# Patient Record
Sex: Female | Born: 1970 | Race: White | Hispanic: No | Smoking: Former smoker
Health system: Southern US, Community
[De-identification: ages and names within clinical notes are randomized; demographics above are authoritative.]

## PROBLEM LIST (undated history)

## (undated) DIAGNOSIS — D649 Anemia, unspecified: Secondary | ICD-10-CM

## (undated) DIAGNOSIS — F419 Anxiety disorder, unspecified: Secondary | ICD-10-CM

## (undated) DIAGNOSIS — O24419 Gestational diabetes mellitus in pregnancy, unspecified control: Secondary | ICD-10-CM

## (undated) DIAGNOSIS — F329 Major depressive disorder, single episode, unspecified: Secondary | ICD-10-CM

## (undated) DIAGNOSIS — F32A Depression, unspecified: Secondary | ICD-10-CM

## (undated) HISTORY — DX: Depression, unspecified: F32.A

## (undated) HISTORY — DX: Gestational diabetes mellitus in pregnancy, unspecified control: O24.419

## (undated) HISTORY — DX: Anemia, unspecified: D64.9

## (undated) HISTORY — DX: Anxiety disorder, unspecified: F41.9

## (undated) HISTORY — PX: IUD REMOVAL: SHX5392

## (undated) HISTORY — PX: INTRAUTERINE DEVICE (IUD) INSERTION: SHX5877

## (undated) HISTORY — DX: Major depressive disorder, single episode, unspecified: F32.9

---

## 1998-04-23 ENCOUNTER — Emergency Department (HOSPITAL_COMMUNITY): Admission: EM | Admit: 1998-04-23 | Discharge: 1998-04-24 | Payer: Self-pay | Admitting: Emergency Medicine

## 1998-04-24 ENCOUNTER — Encounter: Payer: Self-pay | Admitting: Emergency Medicine

## 2003-01-16 ENCOUNTER — Encounter: Payer: Self-pay | Admitting: General Surgery

## 2003-01-16 ENCOUNTER — Ambulatory Visit (HOSPITAL_COMMUNITY): Admission: RE | Admit: 2003-01-16 | Discharge: 2003-01-16 | Payer: Self-pay | Admitting: *Deleted

## 2004-09-17 ENCOUNTER — Emergency Department (HOSPITAL_COMMUNITY): Admission: EM | Admit: 2004-09-17 | Discharge: 2004-09-17 | Payer: Self-pay | Admitting: Emergency Medicine

## 2007-01-14 ENCOUNTER — Other Ambulatory Visit: Admission: RE | Admit: 2007-01-14 | Discharge: 2007-01-14 | Payer: Self-pay | Admitting: Obstetrics & Gynecology

## 2007-02-12 ENCOUNTER — Emergency Department (HOSPITAL_COMMUNITY): Admission: EM | Admit: 2007-02-12 | Discharge: 2007-02-12 | Payer: Self-pay | Admitting: Family Medicine

## 2007-02-13 ENCOUNTER — Emergency Department (HOSPITAL_COMMUNITY): Admission: EM | Admit: 2007-02-13 | Discharge: 2007-02-13 | Payer: Self-pay | Admitting: Family Medicine

## 2007-02-13 ENCOUNTER — Emergency Department (HOSPITAL_COMMUNITY): Admission: EM | Admit: 2007-02-13 | Discharge: 2007-02-13 | Payer: Self-pay | Admitting: Emergency Medicine

## 2007-02-14 ENCOUNTER — Inpatient Hospital Stay (HOSPITAL_COMMUNITY): Admission: EM | Admit: 2007-02-14 | Discharge: 2007-02-17 | Payer: Self-pay | Admitting: Emergency Medicine

## 2007-02-15 ENCOUNTER — Ambulatory Visit: Payer: Self-pay | Admitting: Infectious Diseases

## 2008-01-19 ENCOUNTER — Other Ambulatory Visit: Admission: RE | Admit: 2008-01-19 | Discharge: 2008-01-19 | Payer: Self-pay | Admitting: Obstetrics and Gynecology

## 2008-03-24 DIAGNOSIS — R142 Eructation: Secondary | ICD-10-CM

## 2008-03-24 DIAGNOSIS — R141 Gas pain: Secondary | ICD-10-CM | POA: Insufficient documentation

## 2008-03-24 DIAGNOSIS — R143 Flatulence: Secondary | ICD-10-CM

## 2008-03-24 DIAGNOSIS — F411 Generalized anxiety disorder: Secondary | ICD-10-CM | POA: Insufficient documentation

## 2008-03-24 DIAGNOSIS — R109 Unspecified abdominal pain: Secondary | ICD-10-CM | POA: Insufficient documentation

## 2008-05-09 ENCOUNTER — Ambulatory Visit: Payer: Self-pay | Admitting: Internal Medicine

## 2008-05-09 DIAGNOSIS — R1033 Periumbilical pain: Secondary | ICD-10-CM | POA: Insufficient documentation

## 2008-05-10 LAB — CONVERTED CEMR LAB
ALT: 14 units/L (ref 0–35)
AST: 19 units/L (ref 0–37)
Albumin: 4.3 g/dL (ref 3.5–5.2)
Alkaline Phosphatase: 34 units/L — ABNORMAL LOW (ref 39–117)
BUN: 8 mg/dL (ref 6–23)
CO2: 29 meq/L (ref 19–32)
Calcium: 9.1 mg/dL (ref 8.4–10.5)
Chloride: 106 meq/L (ref 96–112)
Creatinine, Ser: 0.5 mg/dL (ref 0.4–1.2)
GFR calc Af Amer: 179 mL/min
GFR calc non Af Amer: 148 mL/min
Glucose, Bld: 94 mg/dL (ref 70–99)
Potassium: 3.7 meq/L (ref 3.5–5.1)
Sed Rate: 4 mm/hr (ref 0–22)
Sodium: 141 meq/L (ref 135–145)
TSH: 1.32 microintl units/mL (ref 0.35–5.50)
Total Bilirubin: 0.8 mg/dL (ref 0.3–1.2)
Total Protein: 7.6 g/dL (ref 6.0–8.3)

## 2008-05-11 LAB — CONVERTED CEMR LAB: Tissue Transglutaminase Ab, IgA: 0.3 units (ref <7)

## 2008-06-15 ENCOUNTER — Encounter: Payer: Self-pay | Admitting: Internal Medicine

## 2010-08-18 ENCOUNTER — Encounter: Payer: Self-pay | Admitting: Internal Medicine

## 2010-08-27 NOTE — Assessment & Plan Note (Signed)
Summary: STOMCG ISSUES AFTER MEALS/FH   History of Present Illness Visit Type: consult Primary GI MD: Lina Sar MD Primary Provider: Rada Hay Requesting Provider: Willey Blade Chief Complaint: Stomach problems after meals History of Present Illness:   40 year old white female with chronic digestive problems specifically bloating and abdominal pain postprandially, food intolerance, irregular bowel habits. Her symptoms date back to childhood . She was told by her parents as well as by her first husband that her problems were  related to  her nerves. She now for the first time has decided to pursue this   with GI work up..  She has  been on gluten-free diet for several months because she felt that wheat products are causing lot of her symptoms .She also claims to be lactose intolerant. Weight has been stable. She smokes 5-6 cigarettes a day and has a glass of wine every day. There's no family history of gallbladder disease. There is no family history of Crohn's disease or ulcerative colitis. Her symptoms are worse when she stressed out.   GI Review of Systems    Reports abdominal pain, bloating, heartburn, and  nausea.     Location of  Abdominal pain: upper abdomen.    Denies belching, chest pain, dysphagia with liquids, dysphagia with solids, loss of appetite, vomiting, vomiting blood, weight loss, and  weight gain.      Reports diarrhea and  liver problems.     Denies anal fissure, black tarry stools, change in bowel habit, constipation, diverticulosis, fecal incontinence, heme positive stool, hemorrhoids, irritable bowel syndrome, jaundice, light color stool, rectal bleeding, and  rectal pain.     Prior Medication List:  No prior medications documented  Updated Prior Medication List: * PROGESTERONE RING WITH IUD Change once per week  Current Allergies (reviewed today): ! ERYTHROMYCIN  Past Medical History:    Reviewed history from 03/24/2008 and no changes required:      Current Problems:        ABDOMINAL BLOATING (ICD-787.3)       ABDOMINAL CRAMPS (ICD-789.00)       ANXIETY (ICD-300.00)          Family History:    Reviewed history and no changes required:       No FH of Colon Cancer:       Family History of Diabetes: mother  Social History:    Reviewed history from 03/24/2008 and no changes required:       Occupation: Immunologist       Patient currently smokes. -5 cigarettes daily       Alcohol Use - yes-1 glass wine daily       Illicit Drug Use - no       Patient gets regular exercise.-walks 4x per week    Review of Systems       The patient complains of allergy/sinus and anxiety-new.     Vital Signs:  Patient Profile:   40 Years Old Female Height:     61 inches Weight:      130.25 pounds BMI:     24.70 BSA:     1.58 Pulse rate:   60 / minute Pulse rhythm:   regular BP sitting:   100 / 62  (left arm)  Vitals Entered By: Paulene Floor, RN (May 09, 2008 1:25 PM)                  Physical Exam  General:     Well developed, well nourished, no  acute distress. Neck:     Supple; no masses or thyromegaly. Lungs:     Clear throughout to auscultation. Heart:     Regular rate and rhythm; no murmurs, rubs,  or bruits. Abdomen:     soft with decreased muscle support, hyperactive bowel sounds, no tenderness, liver edge at costal margin Rectal:     normal rectal tone and normal perianal area. Stool is Hemoccult negative Extremities:     No clubbing, cyanosis, edema or deformities noted.    Impression & Recommendations:  Problem # 1:  ABDOMINAL PAIN, PERIUMBILICAL (ICD-789.05) abdominal discomfort bloating and food intolerance suggestive of an irritable bowel syndrome. We need to consider  celiac sprue, inflammatory bowel disease or other  GI conditions which would  explain her symptoms. We will proceed with abdominal ultrasound and tissue transglutaminase levels. Start  Bentyl 10 mg twice a day. She is  currently undergoing psychological evauation and will be started on antidepressants. I would like to proceed with endoscopic exam of the upper and lower GI tract for the purpose of small bowel biopsies and to rule out collagenous or microscopic colitis. Patient would like to wait until her blood tests are back to schedule. Orders: T-Tissue Transglutamase Ab IgA [phone]) Ultrasound Abdomen (UAS) TLB-Sedimentation Rate (ESR) (85651-ESR) TLB-CMP (Comprehensive Metabolic Pnl) (80053-COMP) TLB-TSH (Thyroid Stimulating Hormone) (84443-TSH)   Problem # 2:  ANXIETY (ICD-300.00) chronic anxiety and possible depression. In the care of the counselor. Patient will be  started on antidepressants.   Patient Instructions: 1)  upper abdominal ultrasound 2)  Blood tests including TSH, hepatic function, tissue transglutaminase, sedimentation rate 3)  Bentyl  10 mg one p.o. b.i.d 4)  likely upper endoscopy with small bowel biopsy and colonoscopy 5)  Copy Sent To:Dr Leda Quail    Prescriptions: BENTYL 10 MG CAPS (DICYCLOMINE HCL) Take 1 tablet by mouth two times a day  #60 x 1   Entered by:   Hortense Ramal CMA   Authorized by:   Hart Carwin MD   Signed by:   Hortense Ramal CMA on 05/09/2008   Method used:   Electronically to        [facility] [address] [phone]* (retail)       [address]       [place], [place]  [address]       Ph: [phone] or [phone]       Fax: [phone]   RxID:   [id number]  ]

## 2010-08-27 NOTE — Letter (Signed)
Summary: Appointment Reminder  [facility]  [address] [place], [place] [address]   Phone: [phone]  Fax: [phone]        June 15, 2008 MRN: [id number]    Debbie Cline [address] [place], [place]  [address]    Dear Debbie Cline,   Our office has been made aware that you never had the abdominal ultrasound that was recommended for you by Dr. Juanda Chance. We have called and left messages on multiple occasions and have unfortuntely been unable to contact you by phone.   It is very important that we reach you to reschedule an appointment for ultrasound. We hope that you continue to allow [place] to participate in your health care needs. Please contact us [phone] at your earliest convenience to schedule your appointment.     Sincerely,    Hortense Ramal CMA

## 2010-12-10 NOTE — H&P (Signed)
[name], [name] NO.:  [id number]   MEDICAL RECORD NO.:  [id number]          PATIENT TYPE:  INP   LOCATION:  0104                         FACILITY:  [facility]   PHYSICIAN:  Hettie Holstein, D.O.    DATE OF BIRTH:  Jan 31, 1971   DATE OF ADMISSION:  02/14/2007  DATE OF DISCHARGE:                              HISTORY & PHYSICAL   PRIMARY CARE PHYSICIAN:  Dr. Marisue Brooklyn.   CHIEF COMPLAINT:  Facial swelling refractory to outpatient management.   HISTORY OF PRESENTING ILLNESS:  [name] is a very pleasant 40-  year-old healthy female who states that she typically has issues with  ALLERGIC REACTIONS TO POISON IVY ON A YEARLY BASIS.  She states that she  began Thursday with a small blotch on her face just before bed.  This  began to itch, this began in the corner of her right eye.  She woke up  in the morning and this had become swollen.  She went to the Urgent Care  Center on the 18th and saw Dr. Milus Glazier and was provided a Medrol Dose-  pack and Augmentin.  She has used the Medrol Dose-pack, though she did  not take her Augmentin.  She subsequently represented again on the 19th  to [facility] and was provided with some Polytrim ointment and referred  to follow up with Dr. Elisabeth Most.  This persisted, her face continued to  swell; therefore, she presented to the emergency department at [facility] where she was discovered to have marked swelling involving her  right face as well as her left.  She denies any fevers, chills, sweats,  no recent trauma or bites that she can recall, no respiratory  difficulties or breathing difficulties.  She does have some urticarial  lesions on her left antecubital area.   PAST MEDICAL HISTORY:  This is unremarkable.  She denies heart or lung  disease.  She is a smoker.   MEDICATIONS:  1. She has Augmentin which she states she is not taking as prescribed.  2. Medrol Dose-pack.  3. Klonopin p.r.n.   ALLERGIES:  SHE REPORTS  A RASH WITH AMOXICILLIN.   FAMILY HISTORY:  1. Mother is age 14, has diabetes and apparently had some uterine      cancer.  2. Father is in his 43's with a history of hypertension.   SOCIAL HISTORY:  1. She smokes about 1/4 to 1/2 pack of cigarettes per day since she      was age 41.  2. She had 2 children.  3. Many family members suffer with contact dermatitis that is quite      reactive to poison ivy.  4. She works in Primary school teacher, [employer].   REVIEW OF SYSTEMS:  She had been in her usual state of health up until  this past Thursday.  No nausea, vomiting, fevers, sweats, cough.  Otherwise, further review of systems is unremarkable.   PHYSICAL EXAMINATION IN THE EMERGENCY DEPARTMENT:  Her temperature was  98.7, blood pressure was 124/86, heart rate 83, respirations 20, O2  saturations 98%.  HEENT:  Her head is normocephalic.  She does have right facial swelling,  her right eye is partially closed.  She is able to open her right eye  and left eye is also slightly closed but not as pronounced as the right.  This is outlined with a depiction on the initial admission.  CARDIOVASCULAR EXAM:  Normal S1, S2 without murmur.  LUNGS:  She exhibits normal effort.  There is no dullness to percussion.  ABDOMEN:  Soft,  nontender without rebound.  EXTREMITIES:  No edema.  There is an urticarial lesion in the left  anterior antecubital area as well as her left biceps.   ASSESSMENT:  1. Facial swelling/urticaria.  2. Tobacco dependence.  3. Allergic conjunctivitis.   PLAN AT THIS TIME:  Ms. Robley will be admitted for IV steroids as well  as antihistamines and antipruritics.  Will follow her clinical course  and will place her on Benadryl as well as Zyrtec and Cromolyn ophthalmic  solution q.i.d.  She will be placed on observation status.      Hettie Holstein, D.O.  Electronically Signed     ESS/MEDQ  D:  02/14/2007  T:  02/14/2007  Job:  [id number]

## 2010-12-10 NOTE — Discharge Summary (Signed)
Debbie Cline, Debbie Cline NO.:  [id number]   MEDICAL RECORD NO.:  [id number]          PATIENT TYPE:  INP   LOCATION:  1532                         FACILITY:  [facility]   PHYSICIAN:  Elliot Cousin, M.D.    DATE OF BIRTH:  02/27/1971   DATE OF ADMISSION:  02/14/2007  DATE OF DISCHARGE:  02/17/2007                               DISCHARGE SUMMARY   DISCHARGE DIAGNOSES:  1. Facial dermatitis/urticarial rash with cellulitis and facial edema.      Thought to be secondary to an allergic reaction.  2. Low TSH.  3. Pregnancy with plans to terminate.   DISCHARGE MEDICATIONS:  1. Keflex 500 mg b.i.d. for seven more days.  2. Prednisone taper, 40 mg daily x3 days, then 30 mg daily x3 days,      then 20 mg daily x3 days, then 10 mg daily x3 days and then stop.  3. Claritin 10 mg daily.  4. Hydrocortisone cream applied to the face daily as needed for      itching and discomfort.  5. Klonopin p.r.n.   DISCHARGE DISPOSITION:  The patient was discharged home in improved and  stable condition.  She was advised to follow up with Dr. Marisue Brooklyn  in 5-7 days.  She was advised to have her free T4 assessed by Dr.  Elisabeth Most.  She was also advised to have Dr. Elisabeth Most to check the  results of the ANA which is currently pending.   CONSULTATION:  Johny Sax, MD.   PROCEDURE PERFORMED:  None.   HISTORY OF PRESENT ILLNESS:  The patient is a 39 year old woman with no  significant past medical history, who presented to the emergency  department on February 14, 2007, with a chief complaint of facial swelling  and redness.  The patient does have a history of allergic reactions to  poison ivy on a yearly basis.  She presented to the Urgent Care for a  rash on her face, predominantly on the right side.  She was treated with  a Medrol Dosepak and Augmentin.  She did not take Augmentin as she is  allergic to amoxicillin.  Over the course of the few days following, the  pain and swelling  intensified and she therefore presented to the  emergency department at [facility].  She was admitted for  further evaluation and management.   For additional details, please see the dictated history and physical.   HOSPITAL COURSE:  PROBLEM #1 - FACIAL DERMATITIS/URTICARIAL  RASH/CELLULITIS:  The patient was started on Solu-Medrol and Zyrtec  empirically.  Later vancomycin 1 gram IV q.12h. was added for a possible  superimposed bacterial cellulitis.  Cromolyn ophthalmic solution was  started as well, 1-2 drops in both eyes q.i.d.  The patient was also to  be given Benadryl p.r.n..  For further evaluation, infectious disease  physician, Dr. Ninetta Lights, was consulted.  He felt that the rash was more  or less an allergic reaction rather than a bacterial cellulitis.  He  recommended discontinuing vancomycin and adding Pepcid 40 mg daily.  In  addition, he ordered a number  of studies including TSH, ANA, IgE, and a  follow-up CBC.  The results of the ANA is currently pending.  The IgG  was within normal limits at 10.8.  The TSH was low at 0.217.  Her CBC  was unremarkable.  Blood cultures were also ordered and have been  negative x2 days.   The facial swelling and erythema did subside during hospitalization,  however, it did not completely resolve.  She developed two new areas on  her arms which appeared to be a maculopapular rash.  The patient was  adamant and insistent on going home on the day of February 17, 2007.  She  felt that she was well enough to complete outpatient management rather  than staying in the hospital for additional treatment.  Therefore, the  patient was discharged to home on a prednisone taper, Claritin and  p.r.n. hydrocortisone cream.  In addition, she was advised to continue  antibiotic therapy empirically with Keflex 500 mg b.i.d. for an  additional seven days.  The patient was completely afebrile at the time  of hospital discharge.   PROBLEM #2 - LOW TSH:   The patient's TSH was assessed and was found to  be low at 0.217.  The patient did not want to stay for follow-up free T4  measurement.  Therefore she was strongly advised to ask her primary care  physician, Dr. Marisue Brooklyn, to check a free T4 for additional  evaluation.   Of note, the patient recently found out that she was pregnant.  She  plans to terminate the pregnancy.      Elliot Cousin, M.D.  Electronically Signed     DF/MEDQ  D:  02/17/2007  T:  02/18/2007  Job:  [id number]   cc:   Lovenia Kim, D.O.  Fax: [phone]

## 2011-05-12 LAB — CBC
HCT: 37.9
HCT: 41.7
HCT: 42.9
Hemoglobin: 13.2
Hemoglobin: 14.5
Hemoglobin: 14.9
MCHC: 34.7
MCHC: 34.7
MCHC: 34.7
MCV: 91.7
MCV: 92.2
MCV: 92.7
Platelets: 329
Platelets: 332
Platelets: 365
RBC: 4.11
RBC: 4.49
RBC: 4.67
RDW: 12
RDW: 12.2
RDW: 12.3
WBC: 10.9 — ABNORMAL HIGH
WBC: 8.2
WBC: 9.9

## 2011-05-12 LAB — CULTURE, BLOOD (ROUTINE X 2)
Culture: NO GROWTH
Culture: NO GROWTH

## 2011-05-12 LAB — DIFFERENTIAL
Basophils Absolute: 0
Basophils Absolute: 0
Basophils Relative: 0
Basophils Relative: 0
Eosinophils Absolute: 0
Eosinophils Absolute: 0
Eosinophils Relative: 0
Eosinophils Relative: 0
Lymphocytes Relative: 7 — ABNORMAL LOW
Lymphocytes Relative: 9 — ABNORMAL LOW
Lymphs Abs: 0.6 — ABNORMAL LOW
Lymphs Abs: 0.8
Monocytes Absolute: 0.1 — ABNORMAL LOW
Monocytes Absolute: 0.3
Monocytes Relative: 1 — ABNORMAL LOW
Monocytes Relative: 4
Neutro Abs: 7.2
Neutro Abs: 9.2 — ABNORMAL HIGH
Neutrophils Relative %: 87 — ABNORMAL HIGH
Neutrophils Relative %: 93 — ABNORMAL HIGH

## 2011-05-12 LAB — BASIC METABOLIC PANEL
BUN: 8
CO2: 24
Calcium: 9.5
Chloride: 107
Creatinine, Ser: 0.54
GFR calc Af Amer: >60
GFR calc non Af Amer: >60
Glucose, Bld: 129 — ABNORMAL HIGH
Potassium: 3.8
Sodium: 139

## 2011-05-12 LAB — TSH: TSH: 0.217 — ABNORMAL LOW

## 2011-05-12 LAB — ANA: Anti Nuclear Antibody(ANA): NEGATIVE

## 2011-05-12 LAB — IGE: IgE (Immunoglobulin E), Serum: 10.8

## 2011-10-16 LAB — HM MAMMOGRAPHY: HM Mammogram: NEGATIVE

## 2011-11-12 LAB — HM PAP SMEAR: HM Pap smear: NEGATIVE

## 2012-01-28 ENCOUNTER — Other Ambulatory Visit: Payer: Self-pay | Admitting: Internal Medicine

## 2012-01-28 DIAGNOSIS — E041 Nontoxic single thyroid nodule: Secondary | ICD-10-CM

## 2012-01-30 ENCOUNTER — Other Ambulatory Visit: Payer: Self-pay

## 2012-02-02 ENCOUNTER — Other Ambulatory Visit (HOSPITAL_COMMUNITY): Payer: Self-pay | Admitting: Internal Medicine

## 2012-02-02 ENCOUNTER — Ambulatory Visit (HOSPITAL_COMMUNITY)
Admission: RE | Admit: 2012-02-02 | Discharge: 2012-02-02 | Disposition: A | Discharge status: Home / Self Care | Payer: BC Managed Care – PPO | Source: Ambulatory Visit | Attending: Internal Medicine | Admitting: Internal Medicine

## 2012-02-02 DIAGNOSIS — R05 Cough: Secondary | ICD-10-CM

## 2012-02-02 DIAGNOSIS — R5381 Other malaise: Secondary | ICD-10-CM

## 2012-02-02 DIAGNOSIS — R5383 Other fatigue: Secondary | ICD-10-CM | POA: Insufficient documentation

## 2012-02-02 DIAGNOSIS — R059 Cough, unspecified: Secondary | ICD-10-CM | POA: Insufficient documentation

## 2012-02-05 ENCOUNTER — Ambulatory Visit
Admission: RE | Admit: 2012-02-05 | Discharge: 2012-02-05 | Disposition: A | Discharge status: Home / Self Care | Payer: BC Managed Care – PPO | Source: Ambulatory Visit | Attending: Internal Medicine | Admitting: Internal Medicine

## 2012-02-05 DIAGNOSIS — E041 Nontoxic single thyroid nodule: Secondary | ICD-10-CM

## 2012-11-17 ENCOUNTER — Encounter: Payer: Self-pay | Admitting: *Deleted

## 2012-11-18 ENCOUNTER — Ambulatory Visit: Payer: Self-pay | Admitting: Certified Nurse Midwife

## 2012-11-25 ENCOUNTER — Telehealth: Payer: Self-pay | Admitting: Certified Nurse Midwife

## 2012-11-25 ENCOUNTER — Ambulatory Visit: Payer: Self-pay | Admitting: Certified Nurse Midwife

## 2012-11-25 NOTE — Telephone Encounter (Signed)
Patient cx appointment for today with Debbie Cline because she is sick. Patient will call back to reschedule/

## 2013-01-27 ENCOUNTER — Ambulatory Visit: Payer: BC Managed Care – PPO | Admitting: Certified Nurse Midwife

## 2013-03-04 ENCOUNTER — Encounter: Payer: Self-pay | Admitting: Certified Nurse Midwife

## 2013-03-04 ENCOUNTER — Ambulatory Visit (INDEPENDENT_AMBULATORY_CARE_PROVIDER_SITE_OTHER): Payer: BC Managed Care – PPO | Admitting: Certified Nurse Midwife

## 2013-03-04 VITALS — BP 100/64 | HR 64 | Resp 16 | Ht 61.0 in | Wt 143.0 lb

## 2013-03-04 DIAGNOSIS — Z01419 Encounter for gynecological examination (general) (routine) without abnormal findings: Secondary | ICD-10-CM

## 2013-03-04 NOTE — Patient Instructions (Addendum)

## 2013-03-04 NOTE — Progress Notes (Signed)
42 y.o. G14P2002 Divorced Caucasian Fe here for annual exam.  Periods scant to none. Mirena IUD due removal this month. Same partner no STD screening needed.  Recent loss of mother to lung and brain cancer. Emotionally OK, in counseling at present. Patient may not re insert IUD, may do condoms, will decide prior to removal. Patient aware of need to call and schedule and insurance pre cert. Considering becoming a Doula!  Patient's last menstrual period was 09/26/2011.          Sexually active: yes  The current method of family planning is IUD.    Exercising: yes  walking & hiking Smoker:  yes  Health Maintenance: Pap:  4/13 MMG:  3/13 Colonoscopy: none BMD:   none TDaP:  2013 Labs: none Self breast exam: done occ     Past Medical History  Diagnosis Date  . Anxiety     with menses    No past surgical history on file.  Current Outpatient Prescriptions  Medication Sig Dispense Refill  . levonorgestrel (MIRENA) 20 MCG/24HR IUD 1 each by Intrauterine route once.       No current facility-administered medications for this visit.    Family History  Problem Relation Age of Onset  . Diabetes Mother   . Cancer Father     pancreas  . Hypertension Father     ROS:  Pertinent items are noted in HPI.  Otherwise, a comprehensive ROS was negative.  Exam:   Ht 5\' 1"  (1.549 m)  Wt 143 lb (64.864 kg)  BMI 27.03 kg/m2  LMP 09/26/2011 Height: 5\' 1"  (154.9 cm)  Ht Readings from Last 3 Encounters:  03/04/13 5\' 1"  (1.549 m)  05/09/08 5\' 1"  (1.549 m)    General appearance: alert, cooperative and appears stated age Head: Normocephalic, without obvious abnormality, atraumatic Neck: no adenopathy, supple, symmetrical, trachea midline and thyroid normal to inspection and palpation Lungs: clear to auscultation bilaterally Breasts: normal appearance, no masses or tenderness, No nipple retraction or dimpling, No nipple discharge or bleeding, No axillary or supraclavicular adenopathy Heart:  regular rate and rhythm Abdomen: soft, non-tender; no masses,  no organomegaly Extremities: extremities normal, atraumatic, no cyanosis or edema Skin: Skin color, texture, turgor normal. No rashes or lesions Lymph nodes: Cervical, supraclavicular, and axillary nodes normal. No abnormal inguinal nodes palpated Neurologic: Grossly normal   Pelvic: External genitalia:  no lesions              Urethra:  normal appearing urethra with no masses, tenderness or lesions              Bartholin's and Skene's: normal                 Vagina: normal appearing vagina with normal color and discharge, no lesions              Cervix: normal, IUD string visualized              Pap taken: no Bimanual Exam:  Uterus:  normal size, contour, position, consistency, mobility, non-tender and anteverted              Adnexa: normal adnexa and no mass, fullness, tenderness               Rectovaginal: Confirms               Anus:  normal sphincter tone, no lesions  A:  Well Woman with normal exam  Contraception Mirena IUD due for removal this month  P:   Reviewed health and wellness pertinent to exam  Patient will call to schedule removal  Pap smear as per guidelines   Mammogram yearly pap smear not taken today  counseled on breast self exam, mammography screening, STD prevention, adequate intake of calcium and vitamin D, diet and exercise  return annually or prn  An After Visit Summary was printed and given to the patient.

## 2013-03-05 NOTE — Progress Notes (Signed)
Note reviewed, agree with plan.  Haywood Meinders, MD  

## 2013-03-17 ENCOUNTER — Telehealth: Payer: Self-pay | Admitting: *Deleted

## 2013-03-17 NOTE — Telephone Encounter (Signed)
Patient calling to follow up on precert for IUD removal.  Has not heard back and knows she needs to have it removed.  Declines re-insertion.  Appt scheduled for patient and will have insurance dept call her.

## 2013-03-23 ENCOUNTER — Ambulatory Visit (INDEPENDENT_AMBULATORY_CARE_PROVIDER_SITE_OTHER): Payer: BC Managed Care – PPO | Admitting: Certified Nurse Midwife

## 2013-03-23 ENCOUNTER — Encounter: Payer: Self-pay | Admitting: Certified Nurse Midwife

## 2013-03-23 VITALS — BP 110/70 | HR 64 | Resp 16 | Ht 61.0 in | Wt 143.0 lb

## 2013-03-23 DIAGNOSIS — Z30432 Encounter for removal of intrauterine contraceptive device: Secondary | ICD-10-CM

## 2013-03-23 NOTE — Progress Notes (Signed)
Note reviewed, agree with plan.  Saaya Procell, MD  

## 2013-03-23 NOTE — Progress Notes (Signed)
53 yrsCaucasian divorced female, g2p2oo2.     Presents for Mirena removal.  Denies any vaginal symptoms or STD concerns.  Plans for contraception are condoms. Discussed removal procedure. Patient requests same.  LMP 09/26/2011         HPI neg.  O: Healthy WDWN female Affect: Normal, orientation x 3 Exam Abdomen: soft non-tender Groin:no inguinal nodes palpated    Pelvic exam:Pelvic exam: normal external genitalia, vulva, vagina, cervix, uterus and adnexa, VULVA: normal appearing vulva with no masses, tenderness or lesions, VAGINA: normal appearing vagina with normal color and discharge, no lesions, CERVIX: normal appearing cervix without discharge or lesions, UTERUS: uterus is normal size, shape, consistency and nontender, anteverted, ADNEXA: normal adnexa in size, nontender and no masses.  Procedure: Speculum placed, cervix visualized.  IUD string visualized, grasp with ring forceps, with gentle traction IUD removed intact.  IUD shown to patient and discarded. Speculum removed.   Assessment:Mirena removal Pt tolerated procedure well.  Plan: Begin contraceptive choice ofcondoms consistently. Bleeding profile discussed. Notify if no menses in 3 months.  Questions addressed  Return Visit: aex 2015, prn

## 2013-08-16 ENCOUNTER — Telehealth: Payer: Self-pay | Admitting: Certified Nurse Midwife

## 2013-08-16 NOTE — Telephone Encounter (Signed)
Debbie Cline, patient had  Mirena removal done 03/23/13.  Is she okay to start process for reinsertion?

## 2013-08-16 NOTE — Telephone Encounter (Signed)
Patient wants to have iud put back in

## 2013-08-17 ENCOUNTER — Other Ambulatory Visit: Payer: Self-pay | Admitting: Certified Nurse Midwife

## 2013-08-17 DIAGNOSIS — Z30430 Encounter for insertion of intrauterine contraceptive device: Secondary | ICD-10-CM

## 2013-08-17 NOTE — Telephone Encounter (Signed)
Precert completed. Patient aware. She will call back with first day of menses to schedule IUD with Verner Choleborah S. Leonard CNM.

## 2013-08-17 NOTE — Telephone Encounter (Signed)
Yes she has had a Mirena before, I will put order in

## 2013-09-12 ENCOUNTER — Telehealth: Payer: Self-pay | Admitting: Certified Nurse Midwife

## 2013-09-12 ENCOUNTER — Encounter: Payer: Self-pay | Admitting: Certified Nurse Midwife

## 2013-09-12 ENCOUNTER — Ambulatory Visit (INDEPENDENT_AMBULATORY_CARE_PROVIDER_SITE_OTHER): Payer: BC Managed Care – PPO | Admitting: Certified Nurse Midwife

## 2013-09-12 VITALS — BP 118/84 | HR 75 | Resp 16 | Ht 61.0 in | Wt 143.0 lb

## 2013-09-12 DIAGNOSIS — Z30430 Encounter for insertion of intrauterine contraceptive device: Secondary | ICD-10-CM

## 2013-09-12 DIAGNOSIS — Z308 Encounter for other contraceptive management: Secondary | ICD-10-CM

## 2013-09-12 DIAGNOSIS — Z789 Other specified health status: Secondary | ICD-10-CM

## 2013-09-12 LAB — POCT URINE PREGNANCY: Preg Test, Ur: NEGATIVE

## 2013-09-12 NOTE — Telephone Encounter (Signed)
Spoke with patient. Curenntly on menses. Requesting replacement of IUD. Spoke with Verner Choleborah S. Leonard CNM. Okay to add on today at 1515, Pre procedure instructions given.  Motrin instructions given. Motrin=Advil=Ibuprofen, 800 mg one hour before appointment. Eat a meal and hydrate well before appointment.  Patient agreeable.

## 2013-09-12 NOTE — Progress Notes (Signed)
42 yrsDivorcedCaucasianfemale presents for  insertion of Mirena. Denies any vaginal symptoms or STD concerns.  LMP2/15/15  Patient read information regarding IUD insertion.  All questions addressed.    Healthy female,time, place and personnormal menses, no abnormal bleeding, pelvic pain or discharge Abdomen: soft, non-tender Groinno inguinal nodes palpated  Pelvic exam: Vulva;normal female genitalia, Bartholin's, Urethra, Skene normal  Vagina:normal vagina with blood present  Cervix:Non-tender, Negative CMT, no lesions or redness, nulliparous/parous os  Uterus:normal shape, position and consistency, non tender, anteverted   Lab:UPT-neg  Procedure:  Speculum inserted into vagina. Cervix visualized and cleansed with betadine solution X 3. Tenaculum placed on cervix at 10 and 2 o'clock position(s).  Uterus sounded to 7 1/2 centimeters.  IUD removed from sterile packet and under sterile conditions inserted to fundus of uterus.  Introducer removed without difficulty.  IUD string trimmed to 3 centimeters.  Remainder string given to patient to feel for identification.  Tenaculum removed.  Small anount of bleeding noted, Monsel's applied. Speculum removed, no active bleeding noted. Uterus palpated normal.  Patient tolerated procedure well.  A: Insertion of Mirena, Lot # [id number], Expiration date 8/16   P:  Instructions and warnings signs given.       IUD identification card given with IUD removal 09/12/18       Return visit 4- 6 weeks for recheck, prn

## 2013-09-12 NOTE — Progress Notes (Signed)
Reviewed personally.  M. Suzanne Felicidad Sugarman, MD.  

## 2013-09-12 NOTE — Patient Instructions (Signed)

## 2013-09-12 NOTE — Telephone Encounter (Signed)
Patient was told to call when her cycle starts to have IUD inserted. °

## 2013-10-10 ENCOUNTER — Ambulatory Visit: Payer: BC Managed Care – PPO | Admitting: Certified Nurse Midwife

## 2013-10-24 ENCOUNTER — Encounter: Payer: Self-pay | Admitting: Certified Nurse Midwife

## 2013-10-24 ENCOUNTER — Ambulatory Visit (INDEPENDENT_AMBULATORY_CARE_PROVIDER_SITE_OTHER): Payer: BC Managed Care – PPO | Admitting: Certified Nurse Midwife

## 2013-10-24 VITALS — BP 104/64 | HR 70 | Resp 16 | Ht 61.0 in | Wt 141.0 lb

## 2013-10-24 DIAGNOSIS — Z30431 Encounter for routine checking of intrauterine contraceptive device: Secondary | ICD-10-CM

## 2013-10-24 NOTE — Progress Notes (Signed)
43 y.o. Divorced Caucasian female G2P2002 here for follow up of Mirena IUD insertion on 09/12/13.Patient denies any pelvic pain or problems after insertion. She had 1 week of bleeding like a period and minimal spotting now. Happy with choice. Patient has not checked string, but never did with other IUD. No problems with sexual activity.  Happy with choice.  O: Healthy WD,WN female Affect: normal, orientation x 3   Abdomen:non tender Pelvic exam:EXTERNAL GENITALIA: normal appearing vulva with no masses, tenderness or lesions VAGINA: no abnormal discharge or lesions CERVIX: no lesions or cervical motion tenderness and non tender, IUD string noted in cervical os with string length at 3 cm, no change from insertion UTERUS: anteverted and non tender, normal shape ADNEXA: no masses palpable and non tender  A:Normal Mirena IUD surveillance   P: Discussed findings of normal exam and IUD string seen and appropriate length. Questions addressed. Warning signs and symptoms with IUD use reviewed.  RV prn, aex

## 2013-10-25 ENCOUNTER — Encounter: Payer: Self-pay | Admitting: Certified Nurse Midwife

## 2013-10-25 NOTE — Progress Notes (Signed)
Reviewed personally.  M. Suzanne Agam Tuohy, MD.  

## 2014-03-06 ENCOUNTER — Encounter: Payer: Self-pay | Admitting: Certified Nurse Midwife

## 2014-03-06 ENCOUNTER — Ambulatory Visit (INDEPENDENT_AMBULATORY_CARE_PROVIDER_SITE_OTHER): Payer: BC Managed Care – PPO | Admitting: Certified Nurse Midwife

## 2014-03-06 VITALS — BP 110/74 | HR 70 | Resp 16 | Ht 61.25 in | Wt 142.0 lb

## 2014-03-06 DIAGNOSIS — Z01419 Encounter for gynecological examination (general) (routine) without abnormal findings: Secondary | ICD-10-CM

## 2014-03-06 DIAGNOSIS — Z124 Encounter for screening for malignant neoplasm of cervix: Secondary | ICD-10-CM

## 2014-03-06 NOTE — Patient Instructions (Signed)

## 2014-03-06 NOTE — Progress Notes (Signed)
43 y.o. [id number] Divorced Caucasian Fe here for annual exam.Periods normal no issues. IUD working well. No partner change, no STD screening needed. Sees PCP for aex and labs, all normal. Marley(daughter) diagnosed with Bipolar, starting therapy and medication soon. Patient stressed about just knowing this. Has good support system with family and partner. No other health issues today.    Patient's last menstrual period was 02/08/2014.          Sexually active: Yes.    The current method of family planning is IUD.    Exercising: Yes.    hike,walking & yoga Smoker:  no  Health Maintenance: Pap:  11-12-11 neg HPV HR neg MMG: 06-29-13 composition category c: birads category 1:neg Colonoscopy:  none BMD:   none TDaP:  2013 Labs: none Self breast exam: done occ   reports that she has been smoking.  She does not have any smokeless tobacco history on file. She reports that she drinks about 1.8 - 3 ounces of alcohol per week. She reports that she does not use illicit drugs.  Past Medical History  Diagnosis Date  . Anxiety     with menses    Past Surgical History  Procedure Laterality Date  . Iud removal      mirena removed 8/14  . Intrauterine device (iud) insertion      mirena inserted 09-12-13    Current Outpatient Prescriptions  Medication Sig Dispense Refill  . clonazePAM (KLONOPIN) 1 MG tablet Take 1 mg by mouth. Take 1/4 prn      . Fexofenadine HCl (ALLEGRA PO) Take by mouth as needed.      . fluticasone (FLONASE) 50 MCG/ACT nasal spray as needed.      . Multiple Vitamins-Minerals (MULTIVITAMIN PO) Take by mouth daily.      . Omega-3 Fatty Acids (FISH OIL PO) Take by mouth daily.       No current facility-administered medications for this visit.    Family History  Problem Relation Age of Onset  . Diabetes Mother   . Cancer Mother     lung  . Hypertension Mother   . Hypertension Father   . Cancer Father     pancreatic  . Cancer Maternal Grandmother     colon    ROS:   Pertinent items are noted in HPI.  Otherwise, a comprehensive ROS was negative.  Exam:   BP 110/74  Pulse 70  Resp 16  Ht 5' 1.25" (1.556 m)  Wt 142 lb (64.411 kg)  BMI 26.60 kg/m2  LMP 02/08/2014 Height: 5' 1.25" (155.6 cm)  Ht Readings from Last 3 Encounters:  03/06/14 5' 1.25" (1.556 m)  10/24/13 5\' 1"  (1.549 m)  09/12/13 5\' 1"  (1.549 m)    General appearance: alert, cooperative and appears stated age Head: Normocephalic, without obvious abnormality, atraumatic Neck: no adenopathy, supple, symmetrical, trachea midline and thyroid normal to inspection and palpation and non-palpable Lungs: clear to auscultation bilaterally Breasts: normal appearance, no masses or tenderness, No nipple retraction or dimpling, No nipple discharge or bleeding, No axillary or supraclavicular adenopathy Heart: regular rate and rhythm Abdomen: soft, non-tender; no masses,  no organomegaly Extremities: extremities normal, atraumatic, no cyanosis or edema Skin: Skin color, texture, turgor normal. No rashes or lesions Lymph nodes: Cervical, supraclavicular, and axillary nodes normal. No abnormal inguinal nodes palpated Neurologic: Grossly normal   Pelvic: External genitalia:  no lesions              Urethra:  normal appearing urethra with  no masses, tenderness or lesions              Bartholin's and Skene's: normal                 Vagina: normal appearing vagina with normal color and discharge, no lesions              Cervix: normal IUD string noted              Pap taken: Yes.   Bimanual Exam:  Uterus:  normal size, contour, position, consistency, mobility, non-tender and anteverted              Adnexa: normal adnexa and no mass, fullness, tenderness               Rectovaginal: Confirms               Anus:  normal sphincter tone, no lesions  A:  Well Woman with normal exam  Contraception Mirena IUD  Social stress with daughter  P:   Reviewed health and wellness pertinent to exam  Aware of  warning signs with IUD. Mirena due for removal in 2019  Pap smear taken today with reflex  Reviewed nutritional needs, mammogram screening, calcium and vitamin d needs.   return annually or prn  An After Visit Summary was printed and given to the patient.

## 2014-03-08 LAB — IPS PAP TEST WITH REFLEX TO HPV

## 2014-03-08 NOTE — Progress Notes (Signed)
Reviewed personally.  M. Suzanne Bellamy Rubey, MD.  

## 2014-05-29 ENCOUNTER — Encounter: Payer: Self-pay | Admitting: Certified Nurse Midwife

## 2014-06-19 ENCOUNTER — Encounter: Payer: Self-pay | Admitting: Emergency Medicine

## 2014-06-19 ENCOUNTER — Ambulatory Visit (INDEPENDENT_AMBULATORY_CARE_PROVIDER_SITE_OTHER): Payer: BC Managed Care – PPO | Admitting: Emergency Medicine

## 2014-06-19 VITALS — BP 118/80 | HR 68 | Temp 98.2°F | Resp 16 | Ht 61.5 in | Wt 141.0 lb

## 2014-06-19 DIAGNOSIS — Z0001 Encounter for general adult medical examination with abnormal findings: Secondary | ICD-10-CM

## 2014-06-19 DIAGNOSIS — Z0000 Encounter for general adult medical examination without abnormal findings: Secondary | ICD-10-CM

## 2014-06-19 DIAGNOSIS — Z111 Encounter for screening for respiratory tuberculosis: Secondary | ICD-10-CM

## 2014-06-19 DIAGNOSIS — R002 Palpitations: Secondary | ICD-10-CM

## 2014-06-19 DIAGNOSIS — E559 Vitamin D deficiency, unspecified: Secondary | ICD-10-CM

## 2014-06-19 DIAGNOSIS — R1011 Right upper quadrant pain: Secondary | ICD-10-CM

## 2014-06-19 DIAGNOSIS — Z1212 Encounter for screening for malignant neoplasm of rectum: Secondary | ICD-10-CM

## 2014-06-19 DIAGNOSIS — Z79899 Other long term (current) drug therapy: Secondary | ICD-10-CM

## 2014-06-19 DIAGNOSIS — R6889 Other general symptoms and signs: Secondary | ICD-10-CM

## 2014-06-19 LAB — CBC WITH DIFFERENTIAL/PLATELET
Basophils Absolute: 0 10*3/uL (ref 0.0–0.1)
Basophils Relative: 1 % (ref 0–1)
Eosinophils Absolute: 0 10*3/uL (ref 0.0–0.7)
Eosinophils Relative: 1 % (ref 0–5)
HCT: 44.4 % (ref 36.0–46.0)
Hemoglobin: 14.8 g/dL (ref 12.0–15.0)
Lymphocytes Relative: 43 % (ref 12–46)
Lymphs Abs: 2.1 10*3/uL (ref 0.7–4.0)
MCH: 31.8 pg (ref 26.0–34.0)
MCHC: 33.3 g/dL (ref 30.0–36.0)
MCV: 95.3 fL (ref 78.0–100.0)
MPV: 9.4 fL (ref 9.4–12.4)
Monocytes Absolute: 0.5 10*3/uL (ref 0.1–1.0)
Monocytes Relative: 10 % (ref 3–12)
Neutro Abs: 2.2 10*3/uL (ref 1.7–7.7)
Neutrophils Relative %: 45 % (ref 43–77)
Platelets: 335 10*3/uL (ref 150–400)
RBC: 4.66 MIL/uL (ref 3.87–5.11)
RDW: 12.9 % (ref 11.5–15.5)
WBC: 4.9 10*3/uL (ref 4.0–10.5)

## 2014-06-19 LAB — HEMOGLOBIN A1C
Hgb A1c MFr Bld: 5.5 % (ref <5.7)
Mean Plasma Glucose: 111 mg/dL (ref <117)

## 2014-06-19 MED ORDER — CLONAZEPAM 1 MG PO TABS: 1.0000 mg | ORAL_TABLET | Freq: Every day | ORAL | Status: DC

## 2014-06-19 NOTE — Patient Instructions (Signed)
Cholelithiasis Cholelithiasis (also called gallstones) is a form of gallbladder disease. The gallbladder is a small organ that helps you digest fats. Symptoms of gallstones are:  Feeling sick to your stomach (nausea).  Throwing up (vomiting).  Belly pain.  Yellowing of the skin (jaundice).  Sudden pain. You may feel the pain for minutes to hours.  Fever.  Pain to the touch. HOME CARE  Only take medicines as told by your doctor.  Eat a low-fat diet until you see your doctor again. Eating fat can result in pain.  Follow up with your doctor as told. Attacks usually happen time after time. Surgery is usually needed for permanent treatment. GET HELP RIGHT AWAY IF:   Your pain gets worse.  Your pain is not helped by medicines.  You have a fever and lasting symptoms for more than 2-3 days.  You have a fever and your symptoms suddenly get worse.  You keep feeling sick to your stomach and throwing up. MAKE SURE YOU:   Understand these instructions.  Will watch your condition.  Will get help right away if you are not doing well or get worse. Document Released: 12/31/2007 Document Revised: 03/16/2013 Document Reviewed: 01/05/2013 [facility] Patient Information 2015 [place]ExitCare, Maryland[place]C. This information is not intended to replace advice given to you by your health care provider. Make sure you discuss any questions you have with your health care provider. Palpitations A palpitation is the feeling that your heartbeat is irregular. It may feel like your heart is fluttering or skipping a beat. It may also feel like your heart is beating faster than normal. This is usually not a serious problem. In some cases, you may need more medical tests. HOME CARE  Avoid:  Caffeine in coffee, tea, soft drinks, diet pills, and energy drinks.  Chocolate.  Alcohol.  Stop smoking if you smoke.  Reduce your stress and anxiety. Try:  A method that measures bodily functions so you can learn to  control them (biofeedback).  Yoga.  Meditation.  Physical activity such as swimming, jogging, or walking.  Get plenty of rest and sleep. GET HELP IF:  Your fast or irregular heartbeat continues after 24 hours.  Your palpitations occur more often. GET HELP RIGHT AWAY IF:   You have chest pain.  You feel short of breath.  You have a very bad headache.  You feel dizzy or pass out (faint). MAKE SURE YOU:   Understand these instructions.  Will watch your condition.  Will get help right away if you are not doing well or get worse. Document Released: 04/22/2008 Document Revised: 11/28/2013 Document Reviewed: 09/12/2011 [facility] Patient Information 2015 [place]ExitCare, MarylandLLC. This information is not intended to replace advice given to you by your health care provider. Make sure you discuss any questions you have with your health care provider.

## 2014-06-19 NOTE — Progress Notes (Signed)
Subjective:    Patient ID: Debbie Cline, female    DOB: [birth year], 43 y.o.   MRN: [id number]  HPI Comments: 43 yo WF CPE. She has noted palpitations increased over last 2 months. She denies trigger but notes worse at night. She notes it occasionally wakes her up from sleep. She notes she does feel tight in chest at night and cannot lay on either side to sleep. She does note occasional wheeze.  She has noticed increased RUQ pain on/off without food triggers. She had increased belching but denies reflux She has chronic allergy drainage and cough.  She has been using Clonopin PRN usually 1/4 tab 1-2 times a week.  SHe did not follow up for repeat cholesterol labs as directed..  T 217 LDL 111       Medication List       This list is accurate as of: 06/19/14 11:59 PM.  Always use your most recent med list.               ALLEGRA PO  Take by mouth as needed.     clonazePAM 1 MG tablet  Commonly known as:  KLONOPIN  Take 1 tablet (1 mg total) by mouth daily. PRN     FISH OIL PO  Take by mouth daily.     fluticasone 50 MCG/ACT nasal spray  Commonly known as:  FLONASE  as needed.     MULTIVITAMIN PO  Take by mouth daily.       Allergies  Allergen Reactions  . Erythromycin Nausea And Vomiting   Past Medical History  Diagnosis Date  . Anxiety     with menses  . Anemia   . Depression     menses related  . Gestational diabetes    Past Surgical History  Procedure Laterality Date  . Iud removal      mirena removed 8/14  . Intrauterine device (iud) insertion      mirena inserted 09-12-13   History  Substance Use Topics  . Smoking status: Current Some Day Smoker  . Smokeless tobacco: Not on file  . Alcohol Use: 1.8 - 3.0 oz/week    3-5 Glasses of wine per week   Family History  Problem Relation Age of Onset  . Diabetes Mother   . Cancer Mother     lung  . Hypertension Mother   . Heart disease Mother   . Depression Mother   . Hypertension Father   .  Cancer Father     pancreatic  . Heart disease Father   . Hyperlipidemia Father   . Cancer Maternal Grandmother     colon  . Stroke Maternal Grandmother   . Cancer Cousin     breast    MAINTENANCE: Mammo:06/29/2013 Pap/ Pelvic:03/28/14 WNL at GYN EYE:2013, WNL Overdue Dentist:q 6 month [place] THYROID:01/2012 stable/NO NODULES CXR: 01/2012  IMMUNIZATIONS: Tdap:01/2012 Influenza: Declines  Patient Care Team: Lucky CowboyWilliam McKeown, MD as PCP - General (Internal Medicine) Gelene Minkimothy Koop, OD as Referring Physician (Optometry) Janalyn HarderStuart Tafeen, MD as Consulting Physician (Dermatology) Chandra BatchFriendly Denistry, (Dentist) Darcel BayleyLeonard, (GYN)  Review of Systems  Respiratory: Positive for chest tightness.   Gastrointestinal: Positive for abdominal pain.       Belching  All other systems reviewed and are negative.  BP 118/80 mmHg  Pulse 68  Temp(Src) 98.2 F (36.8 C) (Temporal)  Resp 16  Ht 5' 1.5" (1.562 m)  Wt 141 lb (63.957 kg)  BMI 26.21 kg/m2     Objective:  Physical Exam  Constitutional: She is oriented to person, place, and time. She appears well-developed and well-nourished. No distress.  HENT:  Head: Normocephalic and atraumatic.  Right Ear: External ear normal.  Left Ear: External ear normal.  Nose: Nose normal.  Mouth/Throat: Oropharynx is clear and moist.  Eyes: Conjunctivae and EOM are normal. Pupils are equal, round, and reactive to light. Right eye exhibits no discharge. Left eye exhibits no discharge. No scleral icterus.  Neck: Normal range of motion. Neck supple. No JVD present. No tracheal deviation present. No thyromegaly present.  Cardiovascular: Normal rate, regular rhythm, normal heart sounds and intact distal pulses.   Pulmonary/Chest: Effort normal and breath sounds normal.  Abdominal: Soft. Bowel sounds are normal. She exhibits no distension and no mass. There is tenderness. There is no rebound and no guarding.  RUQ  Genitourinary:  Def gyn  Musculoskeletal: Normal range  of motion. She exhibits no edema or tenderness.  Lymphadenopathy:    She has no cervical adenopathy.  Neurological: She is alert and oriented to person, place, and time. She has normal reflexes. No cranial nerve deficit. She exhibits normal muscle tone. Coordination normal.  Skin: Skin is warm and dry. No rash noted. No erythema. No pallor.  Full at DR Tafeen, right breast 4 mm flat irreg borders and varying pigment  Psychiatric: She has a normal mood and affect. Her behavior is normal. Judgment and thought content normal.  Nursing note and vitals reviewed.   EKG NSCSPT WNL     Assessment & Plan:  1. CPE- Update screening labs/ History/ Immunizations/ Testing as needed. Advised healthy diet, QD exercise, increase H20 and continue RX/ Vitamins AD.  2. ? Palpitations/ chest tightness vs GB/ GERD- Referral Cardiology for ECHo/ 24 halter monitor, get abdomen U/S. GERD- Diet/ hygiene discussed, May try OTC Nexium and call with results   3.  Irreg Nevi- monitor for any change, call if occurs for removal / [facility]  4. Cholesterol- recheck labs, Need to eat healthier and exercise AD.

## 2014-06-20 LAB — LIPID PANEL
Cholesterol: 188 mg/dL (ref 0–200)
HDL: 91 mg/dL (ref >39)
LDL Cholesterol: 86 mg/dL (ref 0–99)
Total CHOL/HDL Ratio: 2.1 Ratio
Triglycerides: 54 mg/dL (ref <150)
VLDL: 11 mg/dL (ref 0–40)

## 2014-06-20 LAB — BASIC METABOLIC PANEL WITH GFR
BUN: 13 mg/dL (ref 6–23)
CO2: 25 mEq/L (ref 19–32)
Calcium: 9.5 mg/dL (ref 8.4–10.5)
Chloride: 104 mEq/L (ref 96–112)
Creat: 0.59 mg/dL (ref 0.50–1.10)
GFR, Est African American: >89 mL/min
GFR, Est Non African American: >89 mL/min
Glucose, Bld: 85 mg/dL (ref 70–99)
Potassium: 5.1 mEq/L (ref 3.5–5.3)
Sodium: 137 mEq/L (ref 135–145)

## 2014-06-20 LAB — URINALYSIS, ROUTINE W REFLEX MICROSCOPIC
Bilirubin Urine: NEGATIVE
Glucose, UA: NEGATIVE mg/dL
Hgb urine dipstick: NEGATIVE
Ketones, ur: NEGATIVE mg/dL
Leukocytes, UA: NEGATIVE
Nitrite: NEGATIVE
Protein, ur: NEGATIVE mg/dL
Specific Gravity, Urine: 1.019 (ref 1.005–1.030)
Urobilinogen, UA: 0.2 mg/dL (ref 0.0–1.0)
pH: 7.5 (ref 5.0–8.0)

## 2014-06-20 LAB — MICROALBUMIN / CREATININE URINE RATIO
Creatinine, Urine: 112.3 mg/dL
Microalb Creat Ratio: 5.3 mg/g (ref 0.0–30.0)
Microalb, Ur: 0.6 mg/dL (ref <2.0)

## 2014-06-20 LAB — HEPATIC FUNCTION PANEL
ALT: 9 U/L (ref 0–35)
AST: 15 U/L (ref 0–37)
Albumin: 4.7 g/dL (ref 3.5–5.2)
Alkaline Phosphatase: 35 U/L — ABNORMAL LOW (ref 39–117)
Bilirubin, Direct: 0.1 mg/dL (ref 0.0–0.3)
Indirect Bilirubin: 0.4 mg/dL (ref 0.2–1.2)
Total Bilirubin: 0.5 mg/dL (ref 0.2–1.2)
Total Protein: 7.3 g/dL (ref 6.0–8.3)

## 2014-06-20 LAB — IRON AND TIBC
%SAT: 31 % (ref 20–55)
Iron: 113 ug/dL (ref 42–145)
TIBC: 363 ug/dL (ref 250–470)
UIBC: 250 ug/dL (ref 125–400)

## 2014-06-20 LAB — TSH: TSH: 1.302 u[IU]/mL (ref 0.350–4.500)

## 2014-06-20 LAB — INSULIN, FASTING: Insulin fasting, serum: 5 u[IU]/mL (ref 2.0–19.6)

## 2014-06-20 LAB — MAGNESIUM: Magnesium: 2.2 mg/dL (ref 1.5–2.5)

## 2014-06-20 LAB — VITAMIN D 25 HYDROXY (VIT D DEFICIENCY, FRACTURES): Vit D, 25-Hydroxy: 31 ng/mL (ref 30–100)

## 2014-06-20 LAB — VITAMIN B12: Vitamin B-12: 539 pg/mL (ref 211–911)

## 2014-06-21 ENCOUNTER — Encounter: Payer: Self-pay | Admitting: Emergency Medicine

## 2014-06-21 DIAGNOSIS — O24419 Gestational diabetes mellitus in pregnancy, unspecified control: Secondary | ICD-10-CM | POA: Insufficient documentation

## 2014-06-28 ENCOUNTER — Ambulatory Visit
Admission: RE | Admit: 2014-06-28 | Discharge: 2014-06-28 | Disposition: A | Discharge status: Home / Self Care | Payer: BC Managed Care – PPO | Source: Ambulatory Visit | Attending: Emergency Medicine | Admitting: Emergency Medicine

## 2014-06-28 DIAGNOSIS — R1011 Right upper quadrant pain: Secondary | ICD-10-CM

## 2014-06-29 ENCOUNTER — Other Ambulatory Visit: Payer: Self-pay | Admitting: Emergency Medicine

## 2014-06-29 DIAGNOSIS — K8020 Calculus of gallbladder without cholecystitis without obstruction: Secondary | ICD-10-CM

## 2014-07-06 ENCOUNTER — Encounter: Payer: Self-pay | Admitting: Physician Assistant

## 2014-07-06 ENCOUNTER — Ambulatory Visit (INDEPENDENT_AMBULATORY_CARE_PROVIDER_SITE_OTHER): Payer: BC Managed Care – PPO | Admitting: Physician Assistant

## 2014-07-06 VITALS — BP 130/84 | HR 72 | Temp 98.1°F | Resp 16 | Ht 61.5 in | Wt 141.8 lb

## 2014-07-06 DIAGNOSIS — R1031 Right lower quadrant pain: Secondary | ICD-10-CM

## 2014-07-06 DIAGNOSIS — R1011 Right upper quadrant pain: Secondary | ICD-10-CM

## 2014-07-06 DIAGNOSIS — R143 Flatulence: Secondary | ICD-10-CM

## 2014-07-06 DIAGNOSIS — R110 Nausea: Secondary | ICD-10-CM

## 2014-07-06 DIAGNOSIS — R141 Gas pain: Secondary | ICD-10-CM

## 2014-07-06 DIAGNOSIS — R142 Eructation: Secondary | ICD-10-CM

## 2014-07-06 DIAGNOSIS — G8929 Other chronic pain: Secondary | ICD-10-CM

## 2014-07-06 MED ORDER — HYOSCYAMINE SULFATE 0.125 MG SL SUBL: 0.1250 mg | SUBLINGUAL_TABLET | SUBLINGUAL | Status: DC | PRN

## 2014-07-06 MED ORDER — ONDANSETRON HCL 4 MG PO TABS: 4.0000 mg | ORAL_TABLET | Freq: Three times a day (TID) | ORAL | Status: DC | PRN

## 2014-07-06 MED ORDER — PROMETHAZINE HCL 25 MG PO TABS: 25.0000 mg | ORAL_TABLET | Freq: Four times a day (QID) | ORAL | Status: DC | PRN

## 2014-07-06 NOTE — Progress Notes (Signed)
   Subjective:    Patient ID: Debbie Cline, female    DOB: 08/15/1970, 43 y.o.   MRN: [id number]  HPI 43 y.o. female with history of IBS,  gallstone without inflammation presents with diarrhea. She had an US 06/28/2014 that showed: IMPRESSION: 1. Multiple gallstones. No gallbladder wall thickening. Negative Murphy sign. No biliary distention.  She states that on Sunday she had diarrhea, with upper AB pain, nausea. Saturday she had wine with cereal and peanut butter. She was feeling better until this AM she ate toast and felt bloated with headache. She took over the counter nausea medication that made her sleep. She states it is a very bloated feeling, "pressure" she states every bump in the car worsened her nausea. She has not been able to eat/ drink since this AM, had coffee with half/half, toast with cheese.    Review of Systems  Constitutional: Positive for chills and appetite change (decreased appetite). Negative for fever, diaphoresis, activity change, fatigue and unexpected weight change.  HENT: Negative.   Respiratory: Negative.   Cardiovascular: Negative.   Gastrointestinal: Positive for nausea, abdominal pain, diarrhea and abdominal distention. Negative for vomiting, constipation, blood in stool, anal bleeding (upper and lower AB) and rectal pain.  Genitourinary: Negative.   Musculoskeletal: Negative.   Skin: Negative.  Negative for rash.  Neurological: Negative.        Objective:   Physical Exam  Constitutional: She is oriented to person, place, and time. She appears well-developed and well-nourished.  Cardiovascular: Normal rate and regular rhythm.   Pulmonary/Chest: Effort normal and breath sounds normal.  Abdominal: Soft. She exhibits no ascites and no mass. Bowel sounds are increased. There is tenderness in the right upper quadrant and right lower quadrant. There is rebound (questionable rebound, no other peritonial signs. ) and guarding. There is no rigidity, no CVA  tenderness, no tenderness at McBurney's point and negative Murphy's sign.  Musculoskeletal: Normal range of motion.  Neurological: She is alert and oriented to person, place, and time.  Skin: Skin is warm and dry. No rash noted.  Psychiatric: Her mood appears anxious.      Assessment & Plan:  Abdominal pain-generalized, RUQ/RLQ worse, questionable rebound without any other peritoneal signs and normal vitals, very anxious-  Patient declines ER at this time check labs stat, bland diet/low fat diet, small portions, increase H20 If WBC very elevated will send to ER for possibly acute cholecycitis  Levsin, phenergan and zofran sent in Patient advised to go to the ER if the symptoms increase or worsen. Follow up with surgeon Tuesday

## 2014-07-06 NOTE — Patient Instructions (Signed)
Low-Fat Diet for Pancreatitis or Gallbladder Conditions °A low-fat diet can be helpful if you have pancreatitis or a gallbladder condition. With these conditions, your pancreas and gallbladder have trouble digesting fats. A healthy eating plan with less fat will help rest your pancreas and gallbladder and reduce your symptoms. °WHAT DO I NEED TO KNOW ABOUT THIS DIET? °· Eat a low-fat diet. °¨ Reduce your fat intake to less than 20-30% of your total daily calories. This is less than 50-60 g of fat per day. °¨ Remember that you need some fat in your diet. Ask your dietician what your daily goal should be. °¨ Choose nonfat and low-fat healthy foods. Look for the words "nonfat," "low fat," or "fat free." °¨ As a guide, look on the label and choose foods with less than 3 g of fat per serving. Eat only one serving. °· Avoid alcohol. °· Do not smoke. If you need help quitting, talk with your health care provider. °· Eat small frequent meals instead of three large heavy meals. °WHAT FOODS CAN I EAT? °Grains °Include healthy grains and starches such as potatoes, wheat bread, fiber-rich cereal, and brown rice. Choose whole grain options whenever possible. In adults, whole grains should account for 45-65% of your daily calories.  °Fruits and Vegetables °Eat plenty of fruits and vegetables. Fresh fruits and vegetables add fiber to your diet. °Meats and Other Protein Sources °Eat lean meat such as chicken and pork. Trim any fat off of meat before cooking it. Eggs, fish, and beans are other sources of protein. In adults, these foods should account for 10-35% of your daily calories. °Dairy °Choose low-fat milk and dairy options. Dairy includes fat and protein, as well as calcium.  °Fats and Oils °Limit high-fat foods such as fried foods, sweets, baked goods, sugary drinks.  °Other °Creamy sauces and condiments, such as mayonnaise, can add extra fat. Think about whether or not you need to use them, or use smaller amounts or low fat  options. °WHAT FOODS ARE NOT RECOMMENDED? °· High fat foods, such as: °¨ Baked goods. °¨ Ice cream. °¨ French toast. °¨ Sweet rolls. °¨ Pizza. °¨ Cheese bread. °¨ Foods covered with batter, butter, creamy sauces, or cheese. °¨ Fried foods. °¨ Sugary drinks and desserts. °· Foods that cause gas or bloating °Document Released: 07/19/2013 Document Reviewed: 07/19/2013 °ExitCare® Patient Information ©2015 ExitCare, LLC. This information is not intended to replace advice given to you by your health care provider. Make sure you discuss any questions you have with your health care provider. ° °

## 2014-07-07 ENCOUNTER — Encounter (INDEPENDENT_AMBULATORY_CARE_PROVIDER_SITE_OTHER): Payer: Self-pay | Admitting: General Surgery

## 2014-07-07 LAB — CBC WITH DIFFERENTIAL/PLATELET
Basophils Absolute: 0 10*3/uL (ref 0.0–0.1)
Basophils Relative: 0 % (ref 0–1)
Eosinophils Absolute: 0.1 10*3/uL (ref 0.0–0.7)
Eosinophils Relative: 1 % (ref 0–5)
HCT: 44.7 % (ref 36.0–46.0)
Hemoglobin: 15 g/dL (ref 12.0–15.0)
Lymphocytes Relative: 45 % (ref 12–46)
Lymphs Abs: 2.9 10*3/uL (ref 0.7–4.0)
MCH: 32.1 pg (ref 26.0–34.0)
MCHC: 33.6 g/dL (ref 30.0–36.0)
MCV: 95.5 fL (ref 78.0–100.0)
MPV: 9.3 fL — ABNORMAL LOW (ref 9.4–12.4)
Monocytes Absolute: 0.6 10*3/uL (ref 0.1–1.0)
Monocytes Relative: 9 % (ref 3–12)
Neutro Abs: 2.9 10*3/uL (ref 1.7–7.7)
Neutrophils Relative %: 45 % (ref 43–77)
Platelets: 330 10*3/uL (ref 150–400)
RBC: 4.68 MIL/uL (ref 3.87–5.11)
RDW: 12.5 % (ref 11.5–15.5)
WBC: 6.4 10*3/uL (ref 4.0–10.5)

## 2014-07-07 LAB — COMPREHENSIVE METABOLIC PANEL
ALT: 10 U/L (ref 0–35)
AST: 15 U/L (ref 0–37)
Albumin: 4.4 g/dL (ref 3.5–5.2)
Alkaline Phosphatase: 40 U/L (ref 39–117)
BUN: 13 mg/dL (ref 6–23)
CO2: 27 mEq/L (ref 19–32)
Calcium: 9.4 mg/dL (ref 8.4–10.5)
Chloride: 100 mEq/L (ref 96–112)
Creat: 0.62 mg/dL (ref 0.50–1.10)
Glucose, Bld: 75 mg/dL (ref 70–99)
Potassium: 4.1 mEq/L (ref 3.5–5.3)
Sodium: 136 mEq/L (ref 135–145)
Total Bilirubin: 0.6 mg/dL (ref 0.2–1.2)
Total Protein: 6.9 g/dL (ref 6.0–8.3)

## 2014-07-07 NOTE — Progress Notes (Unsigned)
Patient ID: Debbie MoJulie L Cieslewicz, female   DOB: [birth year], 43 y.o.   MRN: [id number]  Jola BaptistJulie L. Heng 07/07/2014 2:59 PM Location: [facility] Patient #: [id number] DOB: [birth year] Divorced / Language: [place] / Race: White Female  History of Present Illness Adolph Pollack(Jenilee Franey J. Xaniyah Buchholz MD; 07/07/2014 3:29 PM) Patient words: New patient- Gallbladder.  The patient is a 43 year old female   Note:Debbie Cline is referred by Quentin MullingAmanda Collier, PA because of RUQ pain and gallstones. 5 days ago, Debbie Cline had some significant pressure-type right upper quadrant pain that radiated to the back associated with nausea and diarrhea. This waxed and waned all week but Debbie Cline does feel somewhat better today. Abdominal ultrasound demonstrated multiple gallstones but no common bile duct dilatation. Liver function tests and white blood cell count are normal. Debbie Cline's been restricting her diet. Debbie Cline does drink wine and thinks this may be her trigger.   Other Problems Valli Glance(Sara Sakowski, LPN; 95/62/130812/05/2014 3:00 PM) Anxiety Disorder Back Pain Cholelithiasis Depression Gastroesophageal Reflux Disease  Past Surgical History Valli Glance(Sara Sakowski, LPN; 65/78/469612/05/2014 3:00 PM) No pertinent past surgical history  Diagnostic Studies History Valli Glance(Sara Sakowski, LPN; 29/52/841312/05/2014 3:00 PM) Colonoscopy never Mammogram within last year Pap Smear 1-5 years ago  Allergies Valli Glance(Sara Sakowski, LPN; 24/40/102712/05/2014 3:04 PM) Erythromycin-Sulfisoxazole *ANTI-INFECTIVE AGENTS - MISC.*  Medication History Valli Glance(Sara Sakowski, LPN; 25/36/644012/05/2014 3:05 PM) ZyrTEC Allergy (10MG  Capsule, Oral) Active. KlonoPIN (1MG  Tablet, Oral as needed) Active. Multivitamins (Oral) Active.  Social History Valli Glance(Sara Sakowski, LPN; 34/74/259512/05/2014 3:00 PM) Alcohol use Moderate alcohol use. Caffeine use Coffee. No drug use Tobacco use Former smoker.  Family History Valli Glance(Sara Sakowski, LPN; 63/87/564312/05/2014 3:00 PM) Alcohol Abuse Mother. Cancer Mother. Colon Cancer Family Members In  General. Depression Brother, Daughter, Mother. Diabetes Mellitus Mother. Hypertension Father, Mother. Malignant Neoplasm Of Pancreas Father. Melanoma Mother.  Pregnancy / Birth History Valli Glance(Sara Sakowski, LPN; 32/95/188412/05/2014 3:00 PM) Age at menarche 11 years. Contraceptive History Intrauterine device. Gravida 2 Maternal age 43-30 Para 2 Regular periods  Review of Systems Huntley Dec(Sara [facility] LPN; 16/60/630112/05/2014 3:00 PM) General Present- Appetite Loss, Chills and Fatigue. Not Present- Fever, Night Sweats, Weight Gain and Weight Loss. Skin Not Present- Change in Wart/Mole, Dryness, Hives, Jaundice, New Lesions, Non-Healing Wounds, Rash and Ulcer. HEENT Present- Seasonal Allergies, Sinus Pain and Wears glasses/contact lenses. Not Present- Earache, Hearing Loss, Hoarseness, Nose Bleed, Oral Ulcers, Ringing in the Ears, Sore Throat, Visual Disturbances and Yellow Eyes. Respiratory Not Present- Bloody sputum, Chronic Cough, Difficulty Breathing, Snoring and Wheezing. Breast Not Present- Breast Mass, Breast Pain, Nipple Discharge and Skin Changes. Cardiovascular Present- Palpitations and Rapid Heart Rate. Not Present- Chest Pain, Difficulty Breathing Lying Down, Leg Cramps, Shortness of Breath and Swelling of Extremities. Gastrointestinal Present- Abdominal Pain, Bloating, Change in Bowel Habits, Chronic diarrhea, Constipation, Excessive gas, Gets full quickly at meals, Hemorrhoids, Indigestion and Nausea. Not Present- Bloody Stool, Difficulty Swallowing, Rectal Pain and Vomiting. Female Genitourinary Not Present- Frequency, Nocturia, Painful Urination, Pelvic Pain and Urgency. Musculoskeletal Not Present- Back Pain, Joint Pain, Joint Stiffness, Muscle Pain, Muscle Weakness and Swelling of Extremities. Neurological Present- Headaches. Not Present- Decreased Memory, Fainting, Numbness, Seizures, Tingling, Tremor, Trouble walking and Weakness. Psychiatric Present- Anxiety. Not Present- Bipolar, Change in  Sleep Pattern, Depression, Fearful and Frequent crying. Endocrine Not Present- Cold Intolerance, Excessive Hunger, Hair Changes, Heat Intolerance, Hot flashes and New Diabetes. Hematology Not Present- Easy Bruising, Excessive bleeding, Gland problems, HIV and Persistent Infections.   Vitals Huntley Dec(Sara [facility] LPN; 60/10/932312/05/2014 3:03 PM) 07/07/2014 3:03 PM Weight: 140.38 lb Height: 61in Body  Surface Area: 1.66 m Body Mass Index: 26.52 kg/m Temp.: 98.18F(Oral)  Pulse: 74 (Regular)  BP: 124/72 (Sitting, Left Arm, Standard)    Assessment & Plan Adolph Pollack(Halen Antenucci J. Shigeru Lampert MD; 07/07/2014 3:27 PM) SYMPTOMATIC CHOLELITHIASIS (574.20  K80.20) Current Plans  Follow up as needed Free Text Instructions : discussed with patient and provided information. Pt Education - [facility]   Signed by Adolph Pollackodd J Telsa Dillavou, MD (07/07/2014 3:57 PM)

## 2014-07-14 ENCOUNTER — Ambulatory Visit: Payer: BC Managed Care – PPO | Admitting: Internal Medicine

## 2014-08-07 ENCOUNTER — Other Ambulatory Visit: Payer: Self-pay | Admitting: General Surgery

## 2014-08-11 ENCOUNTER — Ambulatory Visit: Payer: BC Managed Care – PPO | Admitting: Internal Medicine

## 2014-08-23 ENCOUNTER — Ambulatory Visit (INDEPENDENT_AMBULATORY_CARE_PROVIDER_SITE_OTHER): Payer: BLUE CROSS/BLUE SHIELD | Admitting: Physician Assistant

## 2014-08-23 ENCOUNTER — Encounter: Payer: Self-pay | Admitting: Physician Assistant

## 2014-08-23 ENCOUNTER — Ambulatory Visit (HOSPITAL_COMMUNITY)
Admission: RE | Admit: 2014-08-23 | Discharge: 2014-08-23 | Disposition: A | Discharge status: Home / Self Care | Payer: BLUE CROSS/BLUE SHIELD | Source: Ambulatory Visit | Attending: Physician Assistant | Admitting: Physician Assistant

## 2014-08-23 VITALS — BP 116/70 | HR 78 | Temp 98.6°F | Resp 16 | Ht 61.5 in | Wt 140.0 lb

## 2014-08-23 DIAGNOSIS — M25552 Pain in left hip: Secondary | ICD-10-CM | POA: Insufficient documentation

## 2014-08-23 DIAGNOSIS — M533 Sacrococcygeal disorders, not elsewhere classified: Secondary | ICD-10-CM

## 2014-08-23 MED ORDER — MELOXICAM 7.5 MG PO TABS: 7.5000 mg | ORAL_TABLET | Freq: Every day | ORAL | Status: DC

## 2014-08-23 NOTE — Progress Notes (Signed)
HPI  A Caucasian 44 y.o.female presents to the office today due to tailbone pain that started 4 days ago.  Patient went sledding last Sunday and hit a huge bump and has had constant pain since then.  Patient is unable to sit due to constant dull achy pain that is a 8 out of 10 right now.  She states the pain is also aggravated and sharp with certain movements.  She just recently had cholecystectomy on 08/07/14 and "just had her check up with the surgeon and he told her everything looked good.  Therefore, she thought is was OK to go sledding and be more active".  She reports occasional diarrhea due to stool softener, SI joint pain, anxiety and stress.  She denies fever, chills, sweats, fatigue, SOB, CP, abdominal pain, nausea, vomiting, constipation, urinary incontinence, bowel incontinence, dizziness, headaches and lightheadedness.  Review of Systems  Constitutional: Negative.  Negative for fever, chills, malaise/fatigue and diaphoresis.  Respiratory: Negative.   Cardiovascular: Negative.  Negative for chest pain and leg swelling.  Gastrointestinal: Negative.  Negative for nausea, vomiting, abdominal pain, diarrhea and constipation.       Is currently taking stool softener due to recent cholecystectomy and is having occasional diarrhea.  Has pain in coccyx while pushing for bowel movement.  Genitourinary: Negative.  Negative for dysuria, urgency and frequency.  Musculoskeletal: Positive for joint pain.       Pain in coccyx and bilateral SI joints.  Neurological: Negative.  Negative for dizziness.  Psychiatric/Behavioral: Negative for depression. The patient is nervous/anxious.        Anxiety and stress due to recent surgery and current injury and trying to get a job.  Patient states the klonopin do help.   Past Medical History-  Past Medical History  Diagnosis Date  . Anxiety     with menses  . Anemia   . Depression     menses related  . Gestational diabetes    Medications-  Current  Outpatient Prescriptions on File Prior to Visit  Medication Sig Dispense Refill  . clonazePAM (KLONOPIN) 1 MG tablet Take 1 tablet (1 mg total) by mouth daily. PRN 30 tablet 1  . Fexofenadine HCl (ALLEGRA PO) Take by mouth as needed.    . fluticasone (FLONASE) 50 MCG/ACT nasal spray as needed.    . hyoscyamine (LEVSIN/SL) 0.125 MG SL tablet Place 1 tablet (0.125 mg total) under the tongue every 4 (four) hours as needed for cramping (nausea, diarrhea). (Patient not taking: Reported on 08/23/2014) 50 tablet 1  . Multiple Vitamins-Minerals (MULTIVITAMIN PO) Take by mouth daily.     No current facility-administered medications on file prior to visit.   Allergies-  Allergies  Allergen Reactions  . Erythromycin Nausea And Vomiting   Physical Exam BP 116/70 mmHg  Pulse 78  Temp(Src) 98.6 F (37 C) (Temporal)  Resp 16  Ht 5' 1.5" (1.562 m)  Wt 140 lb (63.504 kg)  BMI 26.03 kg/m2  SpO2 98%  LMP 08/10/2014 Wt Readings from Last 3 Encounters:  08/23/14 140 lb (63.504 kg)  07/06/14 141 lb 12.8 oz (64.32 kg)  06/19/14 141 lb (63.957 kg)  Vitals Reviewed. General Appearance: Well nourished, in no apparent distress and had pleasant demeanor.  Patient was seen standing during entire visit due to pain while sitting. Eyes:  PERRLA. EOMI. Conjunctiva is pink without edema, erythema or yellowing. No scleral icterus.   Neck: Supple, trachea is midline. Full range of motion in neck intact Respiratory: CTAB,  r/r/w or  stridor. No increased effort of breathing. Cardio: RRR.   m/r/g.  S1S2nl.   Abdomen: Symmetrical, soft, nontender, and rounded.  +BS nl x4.   Extremities:  C/C/E in upper and lower extremities. Pulses B/L +2   Musculoskeletal: Range of motion and strength intact bilaterally in upper and lower extremities.  Pain in SI joint bilaterally upon palpation.  Back flexion, extension, twisting, and bending side to side are intact.  No kyphosis, lordosis, scoliosis. Skin: Warm, dry, intact  without rashes, lesions, ecchymosis, yellowing, cyanosis.  Neuro: Alert and oriented X3, cooperative.  Mood and affect appropriate to situation.  CN II-XII grossly intact.  Sensation intact.  Unable to test reflexes due to patient being unable to sit down due to pain. Psych: Insight and Judgment appropriate.   Assessment and Plan 1. Coccyx pain- possible fracture? or bruising? Ordered x-rays to R/O fracture. - DG HIPS BILAT WITH PELVIS 3-4 VIEWS; Future.  - DG Sacrum/Coccyx; Future - meloxicam (MOBIC) 7.5 MG tablet; Take 1 tablet (7.5 mg total) by mouth daily.  Dispense: 30 tablet; Refill: 1 Use Donut to sit on due to pain.  Please use heating pad or ice as needed.  Discussed medication effects and SE's.  Pt agreed to treatment plan. If you are not feeling better in the next month, then please call the office. Please keep your physical appt on 06/18/15.  Addendum:  X-ray results showed "No fracture or dislocation is seen. No evidence of displaced sacrococcygeal fracture."  Continue medications as prescribed.  Please let me know if Mobic is not working for pain.  Cahlil Sattar, Lise Auer, PA-C 7:11 PM [facility]

## 2014-08-23 NOTE — Patient Instructions (Signed)
-  Take Meloxicam for inflammation with food.  Do not take Ibuprofen, Advil, Aleve, or Naproxen over the counter while on this medication. - Please try heating pad or ice and please get donut seat for sitting. -[facility]  I will call you with the results.   Tailbone Injury The tailbone (coccyx) is the small bone at the lower end of the spine. A tailbone injury may involve stretched ligaments, bruising, or a broken bone (fracture). Women are more vulnerable to this injury due to having a wider pelvis. CAUSES  This type of injury typically occurs from falling and landing on the tailbone. Repeated strain or friction from actions such as rowing and bicycling may also injure the area. The tailbone can be injured during childbirth. Infections or tumors may also press on the tailbone and cause pain. Sometimes, the cause of injury is unknown. SYMPTOMS   Bruising.  Pain when sitting.  Painful bowel movements.  In women, pain during intercourse. DIAGNOSIS  Your caregiver can diagnose a tailbone injury based on your symptoms and a physical exam. X-rays may be taken if a fracture is suspected. Your caregiver may also use an MRI scan imaging test to evaluate your symptoms. TREATMENT  Your caregiver may prescribe medicines to help relieve your pain. Most tailbone injuries heal on their own in 4 to 6 weeks. However, if the injury is caused by an infection or tumor, the recovery period may vary. PREVENTION  Wear appropriate padding and sports gear when bicycling and rowing. This can help prevent an injury from repeated strain or friction. HOME CARE INSTRUCTIONS   Put ice on the injured area.  Put ice in a plastic bag.  Place a towel between your skin and the bag.  Leave the ice on for 15-20 minutes, every hour while awake for the first 1 to 2 days.  Sit on a large, rubber or inflated ring or cushion to ease your pain. Lean forward when sitting to help decrease  discomfort.  Avoid sitting for long periods of time.  Increase your activity as the pain allows.  Only take over-the-counter or prescription medicines for pain, discomfort, or fever as directed by your caregiver.  You may use stool softeners if it is painful to have a bowel movement, or as directed by your caregiver.  Eat a diet with plenty of fiber to help prevent constipation.  Keep all follow-up appointments as directed by your caregiver. SEEK MEDICAL CARE IF:   Your pain becomes worse.  Your bowel movements cause a great deal of discomfort.  You are unable to have a bowel movement.  You have a fever. MAKE SURE YOU:  Understand these instructions.  Will watch your condition.  Will get help right away if you are not doing well or get worse. Document Released: 07/11/2000 Document Revised: 10/06/2011 Document Reviewed: 02/06/2011 [facility] Patient Information 2015 [place], [place]. This information is not intended to replace advice given to you by your health care provider. Make sure you discuss any questions you have with your health care provider.

## 2014-12-29 IMAGING — US US ABDOMEN COMPLETE
1 series · 14 of 25 positions shown · non-contrast
Comparison: None.

CLINICAL DATA: Abdominal discomfort for 2 weeks.

EXAM:
ULTRASOUND ABDOMEN COMPLETE

[Series 1: us abdomen complete · 0.24mm/px · 14 of 79 slices shown]
[im 1/79]
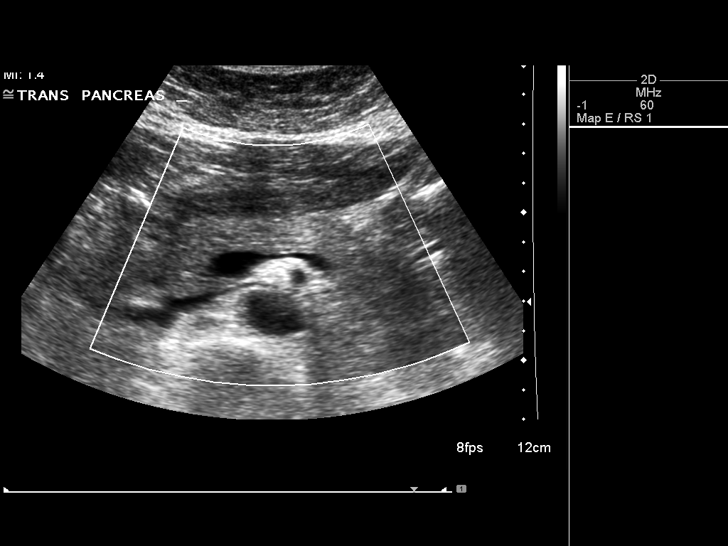
[im 7/79]
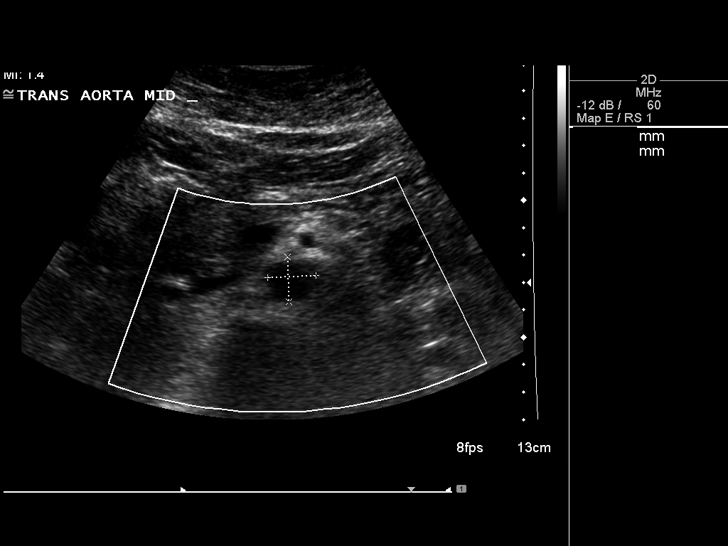
[im 14/79]
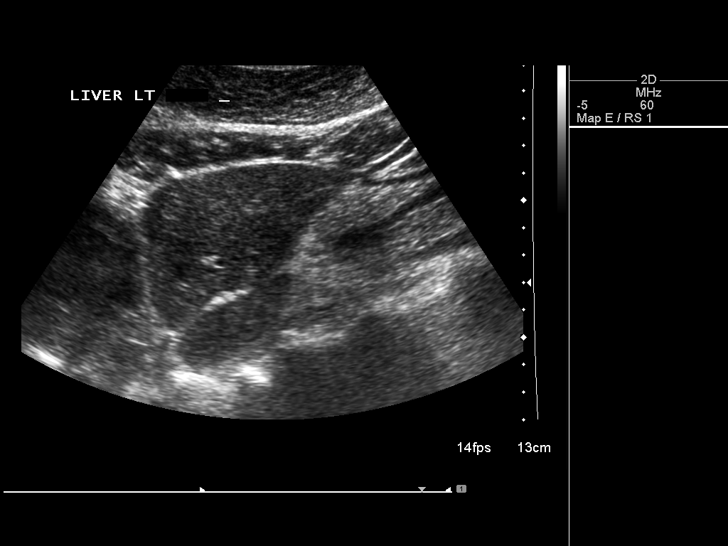
[im 20/79]
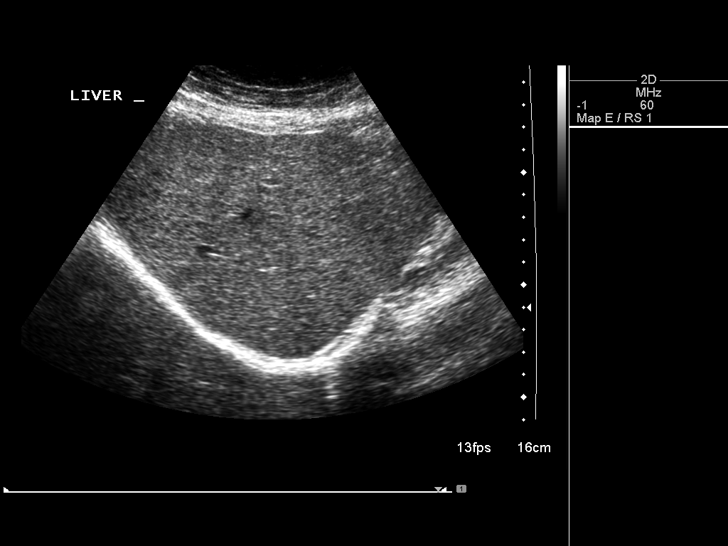
[im 27/79]
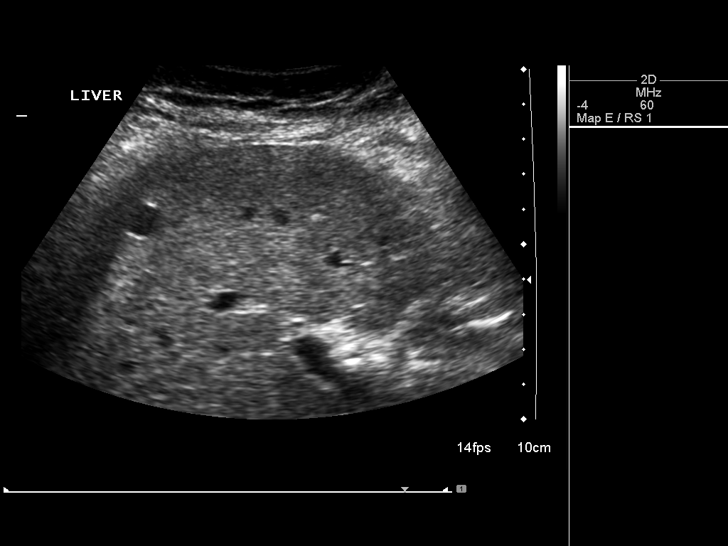
[im 30/79]
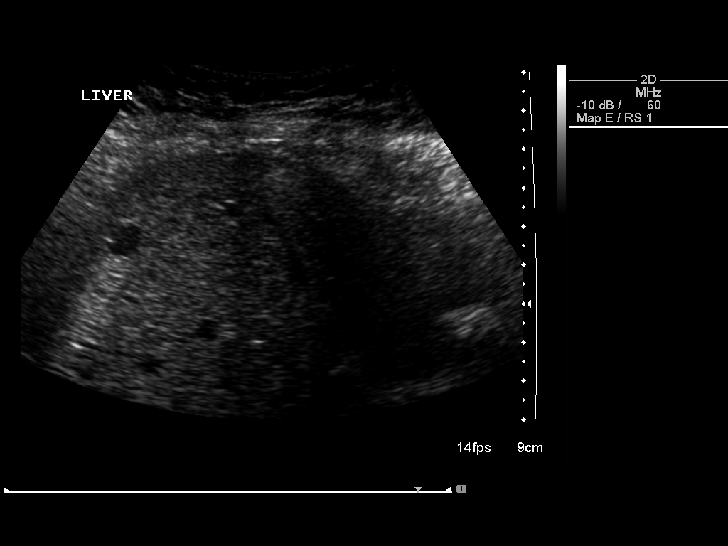
[im 36/79]
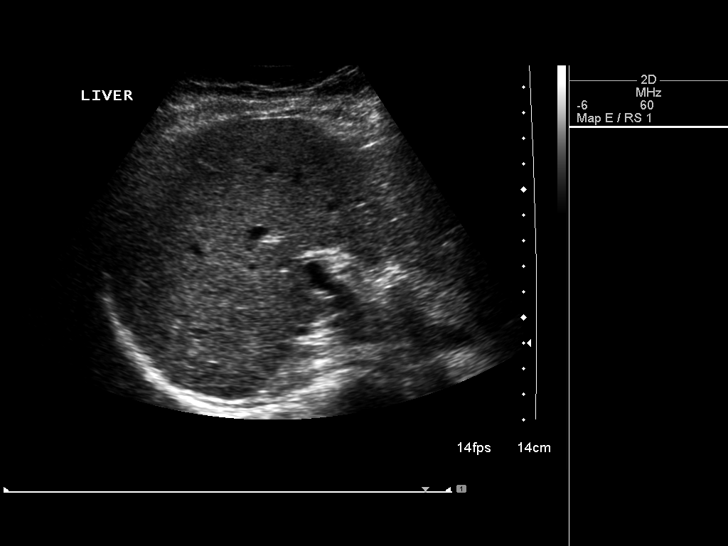
[im 43/79]
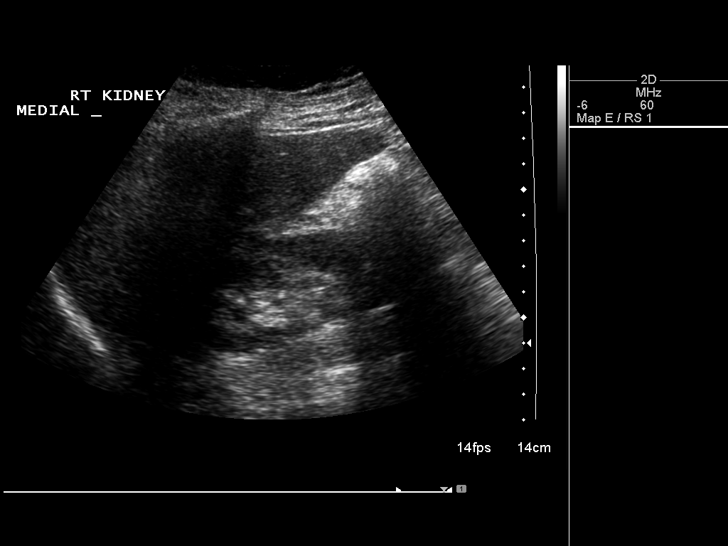
[im 49/79]
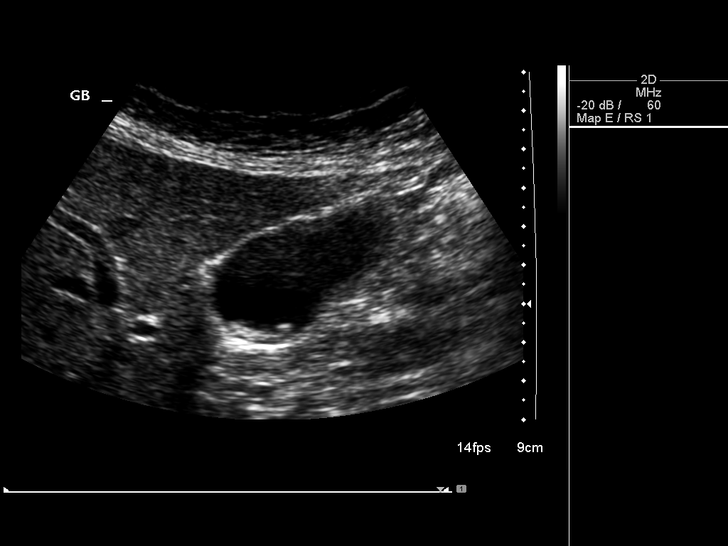
[im 53/79]
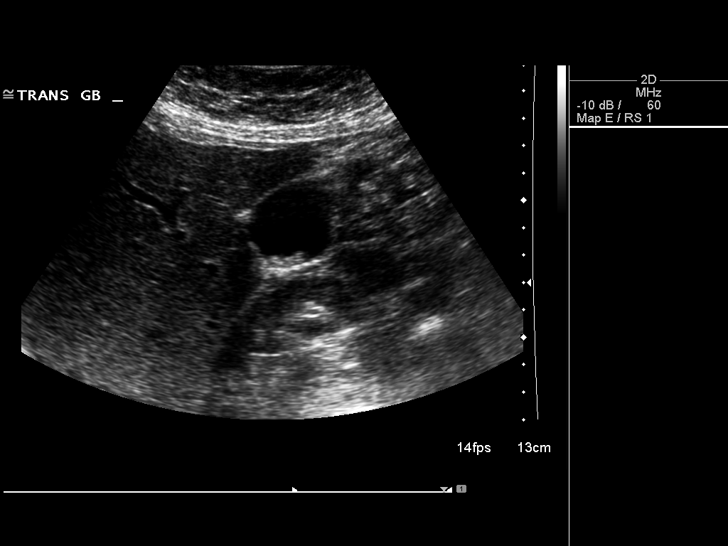
[im 59/79]
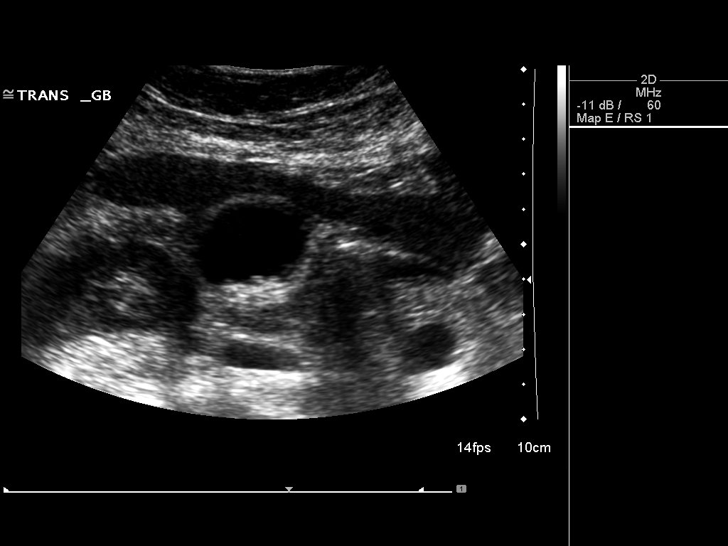
[im 66/79]
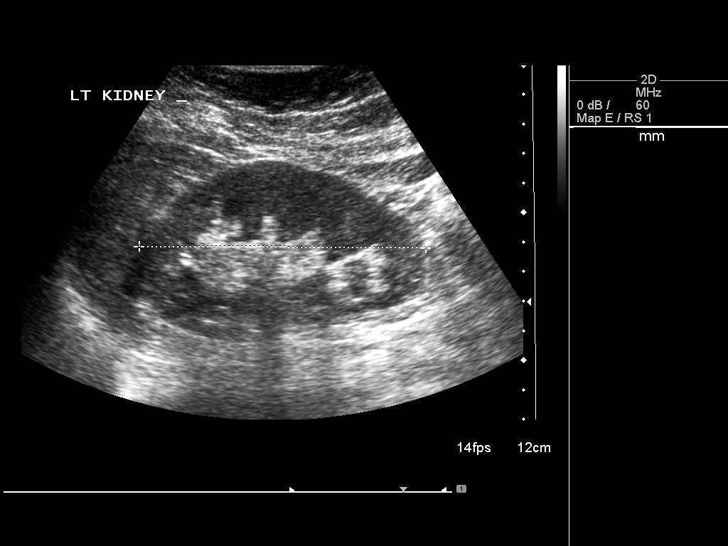
[im 72/79]
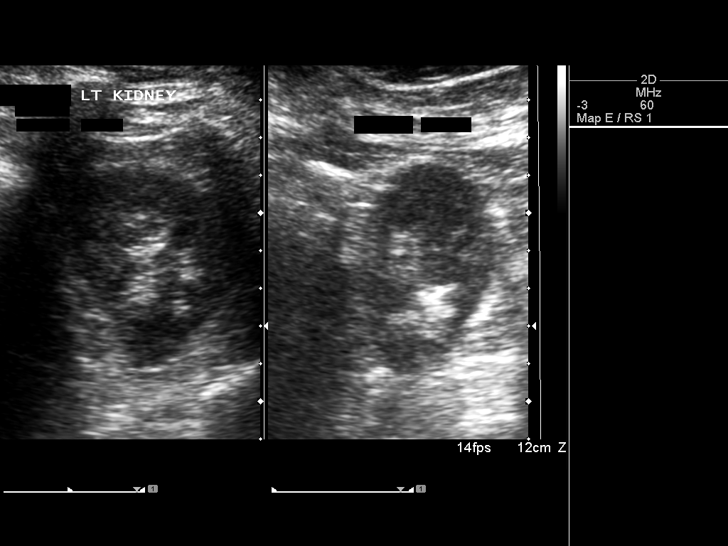
[im 79/79]
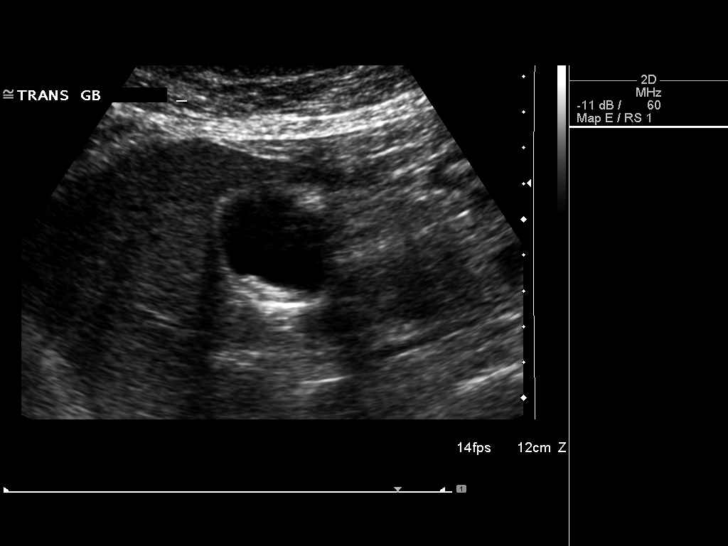

[14 of 25 positions shown; findings below may reference images not displayed]

FINDINGS: Gallbladder: Gallstones. Large gallstone measures 1.8 cm and is non
mobile. Gallbladder Wall thickness 2.1 mm. Negative Murphy sign. No
pathologic pericholecystic fluid collections.

Common bile duct: Diameter: 4.6 mm

Liver: 1 cm simple cyst right hepatic lobe. Liver otherwise
unremarkable.

IVC: No abnormality visualized.

Pancreas: Visualized portion unremarkable.

Spleen: Size and appearance within normal limits.

Right Kidney: Length: 10.0 cm. Echogenicity within normal limits. No
mass or hydronephrosis visualized.

Left Kidney: Length: 9.7 cm. Echogenicity within normal limits. No
mass or hydronephrosis visualized.

Abdominal aorta: No aneurysm visualized.

Other findings: None.
IMPRESSION: 1. Multiple gallstones. No gallbladder wall thickening. Negative
Murphy sign. No biliary distention.

2.  1 cm simple cyst right hepatic lobe.

## 2015-01-22 ENCOUNTER — Other Ambulatory Visit: Payer: Self-pay

## 2015-01-24 ENCOUNTER — Telehealth: Payer: Self-pay | Admitting: Certified Nurse Midwife

## 2015-01-24 NOTE — Telephone Encounter (Signed)
Spoke with patient. Patient states that two weeks ago she noticed a bump on the outside of her vagina. Bump started off red and is now a purple color. Is painful to the touch and slightly itchy. States that she area has not grown in size but now another bump has appeared close to the first. Advised will need to be seen in office for further evaluation of area. Patient is agreeable. Patient requesting appointment tomorrow afternoon. Has another appointment at 4pm. Appointment scheduled for tomorrow at 2:20pm with Verner Choleborah S. Leonard CNM. Patient is agreeable to date and time.  Routing to provider for final review. Patient agreeable to disposition. Will close encounter.   Patient aware provider will review message and nurse will return call if any additional advice or change of disposition.

## 2015-01-24 NOTE — Telephone Encounter (Signed)
Patient has a bump on the outside of her vagina that is itchy.

## 2015-01-25 ENCOUNTER — Encounter: Payer: Self-pay | Admitting: Certified Nurse Midwife

## 2015-01-25 ENCOUNTER — Ambulatory Visit (INDEPENDENT_AMBULATORY_CARE_PROVIDER_SITE_OTHER): Payer: BLUE CROSS/BLUE SHIELD | Admitting: Certified Nurse Midwife

## 2015-01-25 VITALS — BP 130/88 | HR 90 | Resp 16 | Ht 61.5 in | Wt 142.6 lb

## 2015-01-25 DIAGNOSIS — L731 Pseudofolliculitis barbae: Secondary | ICD-10-CM | POA: Diagnosis not present

## 2015-01-25 NOTE — Patient Instructions (Signed)
°  Ingrown Hair °An ingrown hair is a hair that curls and re-enters the skin instead of growing straight out of the skin. It happens most often with curly hair. It is usually more severe in the neck area, but it can occur in any shaved area, including the beard area, groin, scalp, and legs. An ingrown hair may cause small pockets of infection. °CAUSES  °Shaving closely, tweezing, or waxing, especially curly hair. Using hair removal creams can sometimes lead to ingrown hairs, especially in the groin. °SYMPTOMS  °· Small bumps on the skin. The bumps may be filled with pus. °· Pain. °· Itching. °DIAGNOSIS  °Your caregiver can usually tell what is wrong by doing a physical exam. °TREATMENT  °If there is a severe infection, your caregiver may prescribe antibiotic medicines. Laser hair removal may also be done to help prevent regrowth of the hair. °HOME CARE INSTRUCTIONS  °· Do not shave irritated skin. You may start shaving again once the irritation has gone away. °· If you are prone to ingrown hairs, consider not shaving as much as possible. °· If antibiotics are prescribed, take them as directed. Finish them even if you start to feel better. °· You may use a facial sponge in a gentle circular motion to help dislodge ingrown hairs on the face. °· You may use a hair removal cream weekly, especially on the legs and underarms. Stop using the cream if it irritates your skin. Use caution when using hair removal creams in the groin area. °SHAVING INSTRUCTIONS AFTER TREATMENT °· Shower before shaving. Keep areas to be shaved packed in warm, moist wraps for several minutes before shaving. The warm, moist environment helps soften the hairs and makes ingrown hairs less likely to occur. °· Use thick shaving gels. °· Use a bump fighter razor that cuts hair slightly above the skin level or use an electric shaver with a longer shave setting. °· Shave in the direction of hair growth. Avoid making multiple razor strokes. °· Use  moisturizing lotions after shaving. °Document Released: 10/20/2000 Document Revised: 01/13/2012 Document Reviewed: 10/14/2011 °ExitCare® Patient Information ©2015 [employer], LLC. This information is not intended to replace advice given to you by your health care provider. Make sure you discuss any questions you have with your health care provider. ° °

## 2015-01-25 NOTE — Progress Notes (Signed)
44 y.o.Divorced white female [id number] here with complaint of pubic hair area bump in [place] with onset 2 weeks ago . Does not shave in this area. Denies insect bite to area or trauma. Denies new soap or personal products. Staying in camp like area frequently. Denies any exudate from area. Has not treated with epsom salt tub bath. Has applied antiinflammatory cream with no change. Denies fever or chills. Camped this week end again so ? Bug bite. No other health concerns.    O:Healthy female WDWN Affect: normal, orientation x 3  Exam: Abdomen:soft, non tender Lymph node: no enlargement or tenderness in inguinal area [place] Pub[place] area on left ingrown hair noted with slight increase pink in area with soft macule under and around hair base, visualized with magnifying glass. Area opened with small amount of serous drainage with palpation and culture obtained. No warmth noted from area. Area shown to patient with mirror and feels better after drainage occurred. Area cleaned with alcohol and 2x2 applied to area. Pelvic exam: External genital: normal female     A:Ingrown hair macule   P:Discussed findings of ingrown hair and etiology. Discussed Epsom salt  bath for comfort and to increase drainage if any still present bid. She can just do same as a soak to the area. After soaking apply Bacitracin to area and cover if needed. Warning signs of infection given and to advise. Patient will come back in if does not resolve or changes in the next 3-4 days. Going out of town.[place]. Avoid moist clothes or shaving again in area. Questions addressed. Lab: MRSA culture, skin culture  Rv prn

## 2015-01-26 MED ORDER — FLUCONAZOLE 150 MG PO TABS: 150.0000 mg | ORAL_TABLET | Freq: Once | ORAL | Status: DC

## 2015-01-27 LAB — MRSA CULTURE

## 2015-01-30 ENCOUNTER — Telehealth: Payer: Self-pay

## 2015-01-30 ENCOUNTER — Other Ambulatory Visit: Payer: Self-pay | Admitting: Physician Assistant

## 2015-01-30 MED ORDER — CLONAZEPAM 1 MG PO TABS: 1.0000 mg | ORAL_TABLET | Freq: Every day | ORAL | Status: DC

## 2015-01-30 NOTE — Progress Notes (Signed)
Reviewed personally.  M. Suzanne Romuald Mccaslin, MD.  

## 2015-01-30 NOTE — Telephone Encounter (Signed)
-----   Message from Verner Choleborah S Leonard, CNM sent at 01/30/2015  8:17 AM EDT ----- Notify patient that culture was negative. Patient status.

## 2015-01-30 NOTE — Telephone Encounter (Signed)
Patient notified of results. See lab 

## 2015-01-30 NOTE — Telephone Encounter (Signed)
lmtcb

## 2015-02-23 IMAGING — DX DG SACRUM/COCCYX 2+V
3 series · 3 of 3 positions shown · non-contrast
Comparison: None.

CLINICAL DATA: Sledding accident 4 days ago, sacral pain

EXAM:
SACRUM AND COCCYX - 2+ VIEW

[sacrum ap]
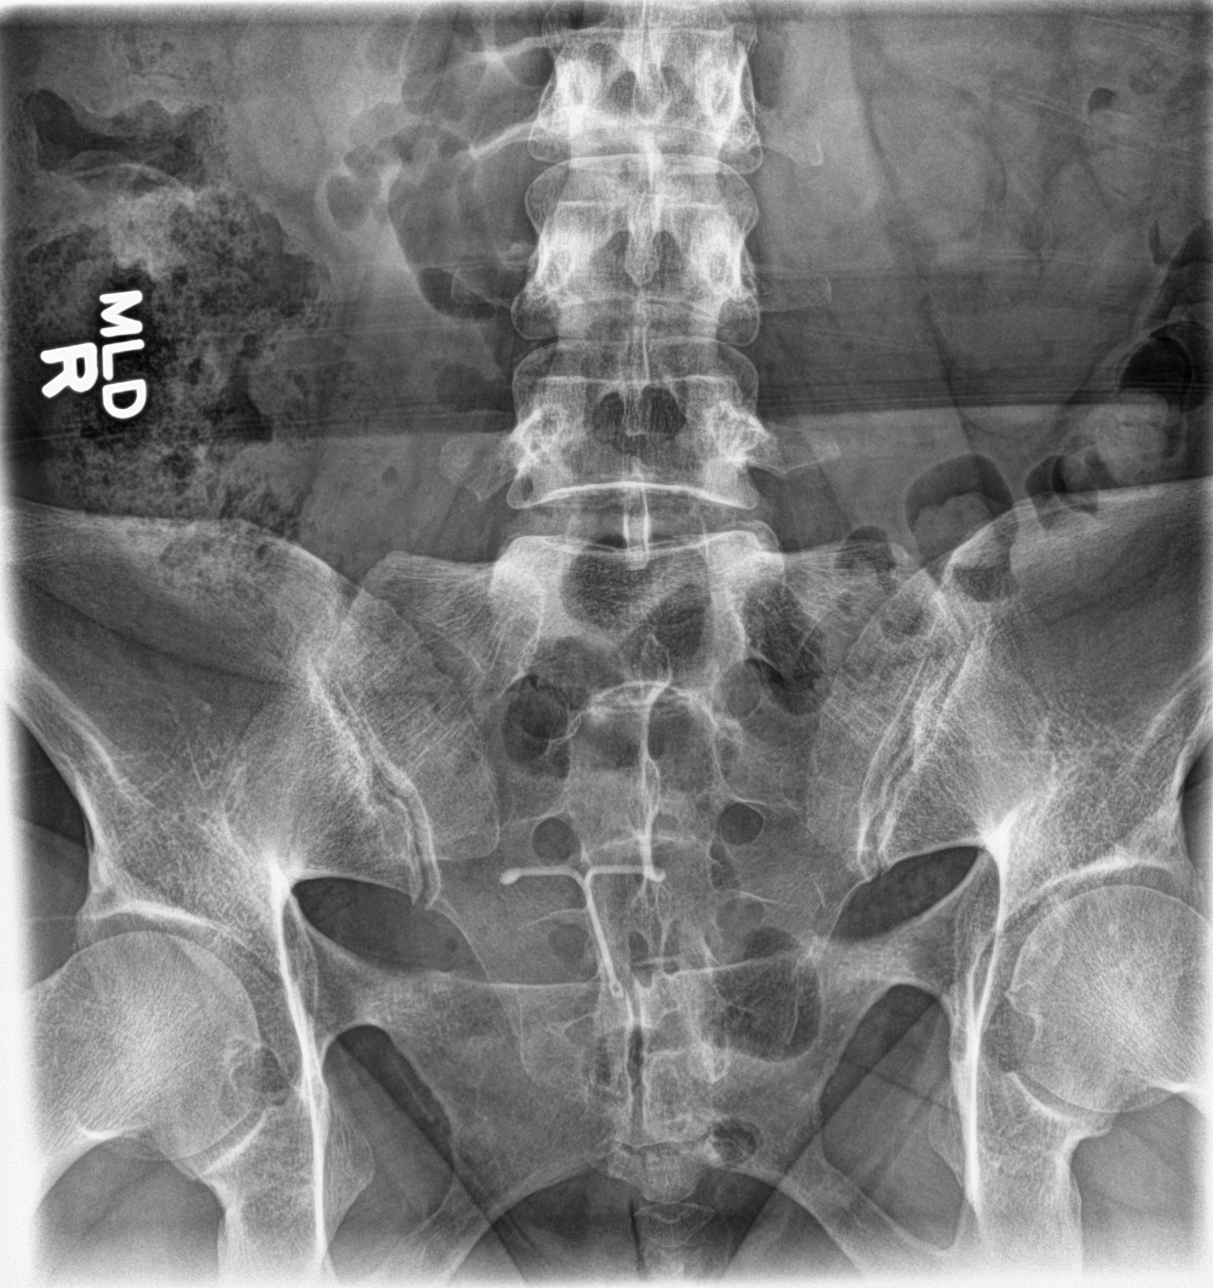

[coccyx ap]
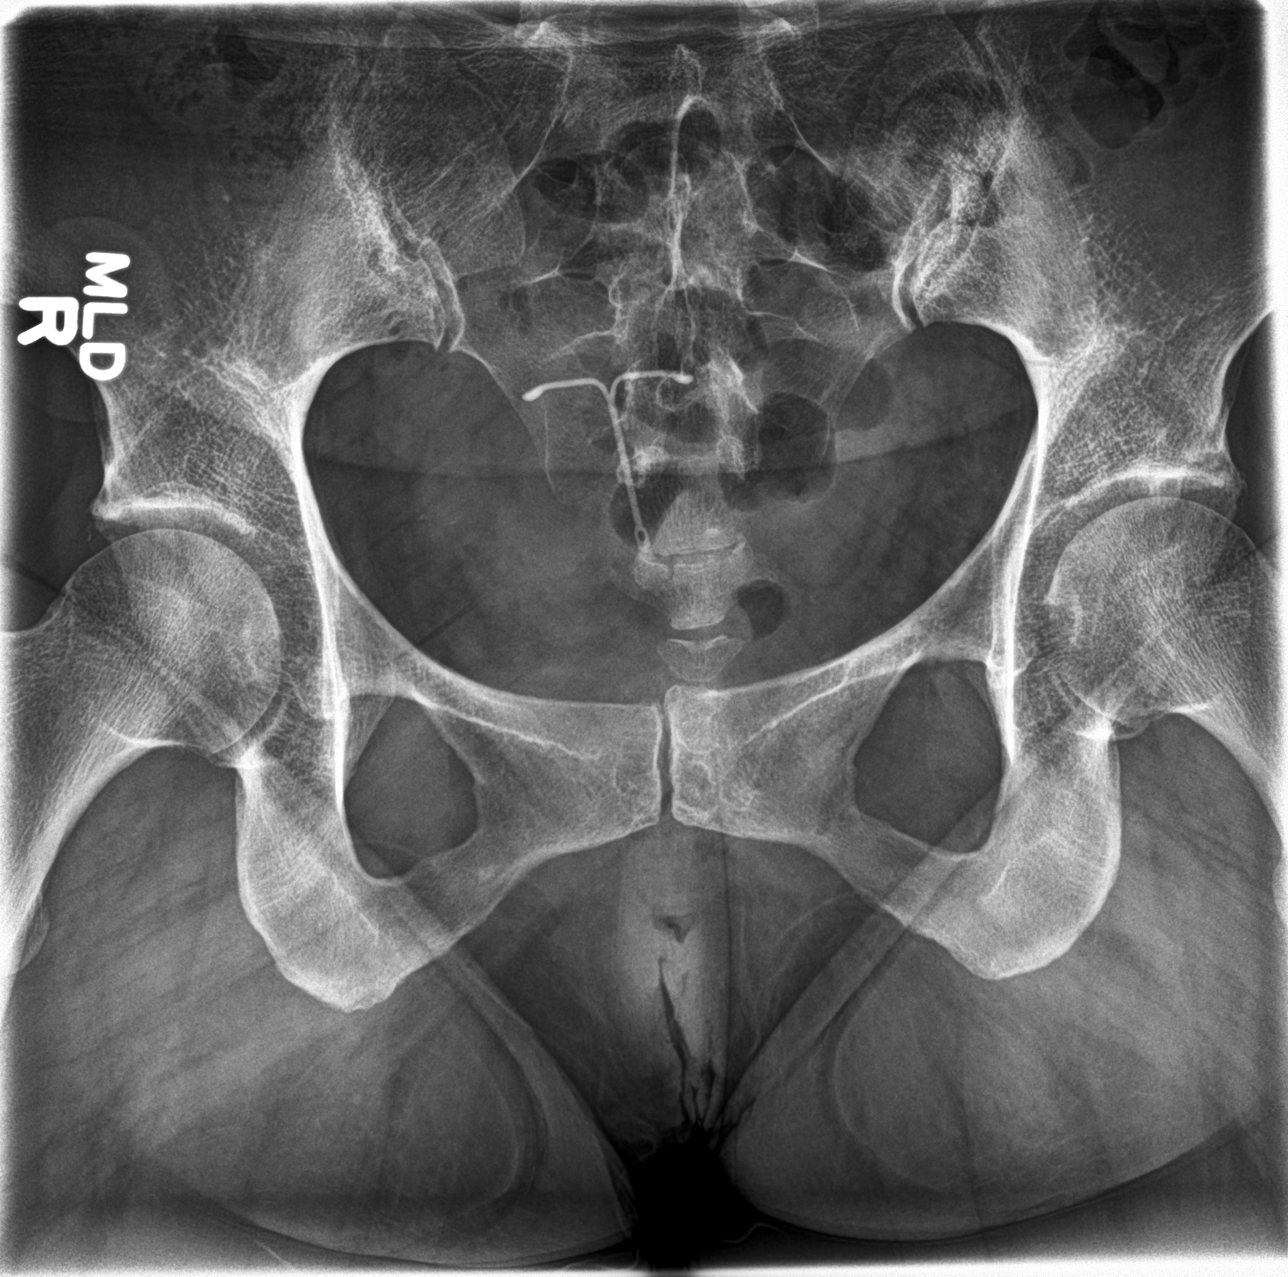

[sacrum lat]
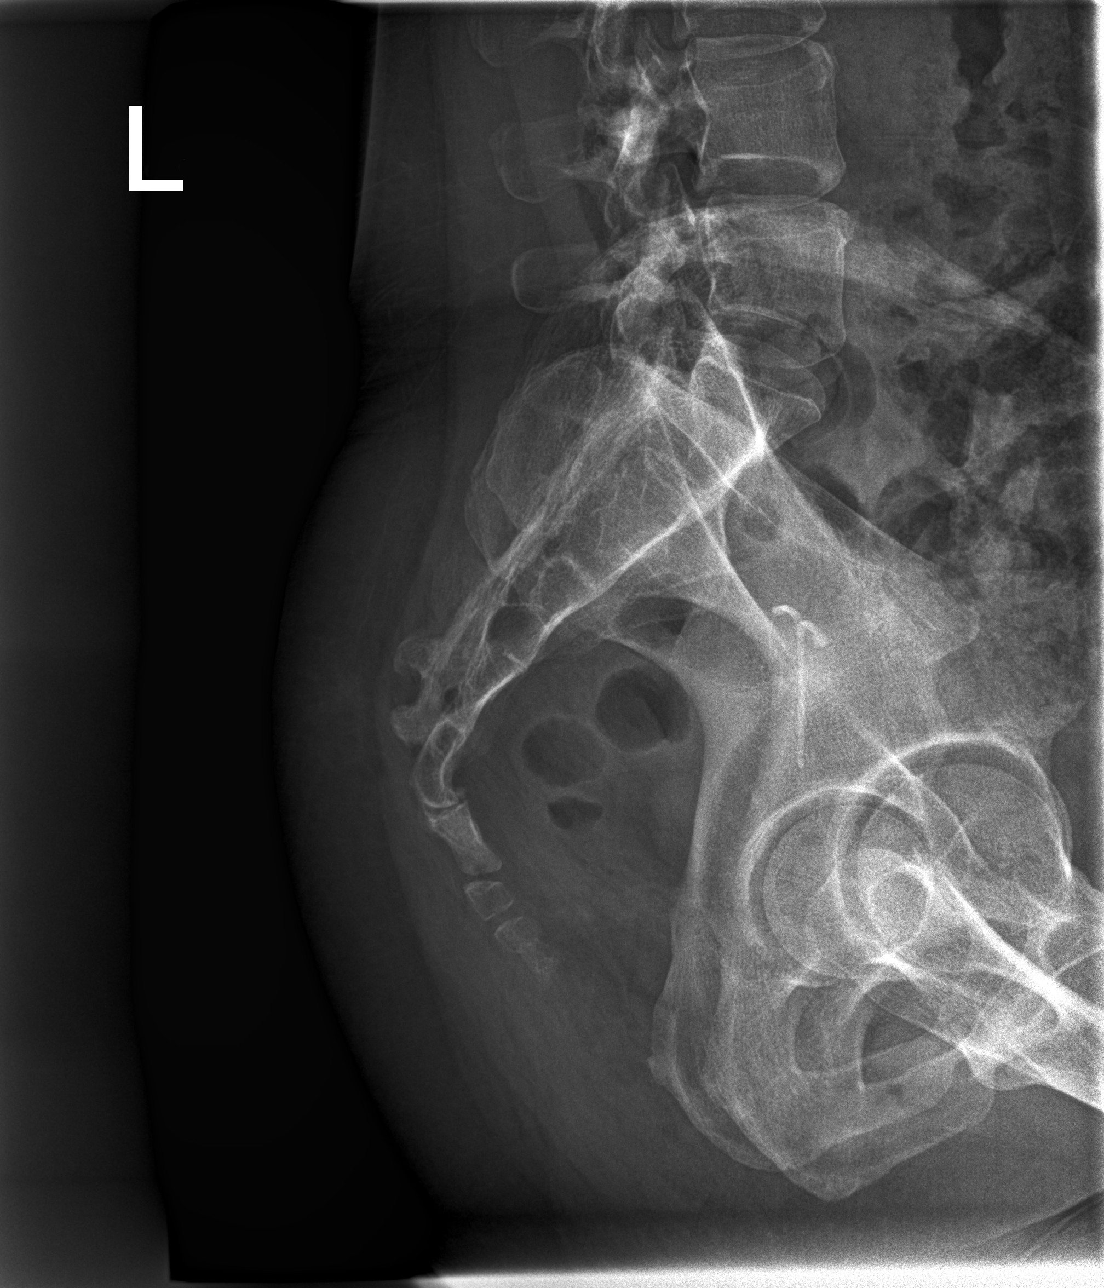

[3 of 3 positions shown; findings below may reference images not displayed]

FINDINGS: No evidence of displaced sacrococcygeal fracture.

Visualized bony pelvis appears intact.

IUD overlying the right sacrum.
IMPRESSION: No evidence of displaced sacrococcygeal fracture.

## 2015-03-08 ENCOUNTER — Encounter: Payer: Self-pay | Admitting: Certified Nurse Midwife

## 2015-03-08 ENCOUNTER — Ambulatory Visit (INDEPENDENT_AMBULATORY_CARE_PROVIDER_SITE_OTHER): Payer: BLUE CROSS/BLUE SHIELD | Admitting: Certified Nurse Midwife

## 2015-03-08 VITALS — BP 112/70 | HR 64 | Resp 18 | Ht 61.0 in | Wt 142.0 lb

## 2015-03-08 DIAGNOSIS — Z01419 Encounter for gynecological examination (general) (routine) without abnormal findings: Secondary | ICD-10-CM | POA: Diagnosis not present

## 2015-03-08 NOTE — Patient Instructions (Signed)

## 2015-03-08 NOTE — Progress Notes (Signed)
Reviewed personally.  M. Suzanne Rockney Grenz, MD.  

## 2015-03-08 NOTE — Progress Notes (Signed)
44 y.o. [id number] Divorced  Caucasian Fe here for annual exam. Periods essentially none with IUD, occasional a period. No partner change, no STD concerns. Sees PCP for aex/labs/medication management of Klonapin.. Planning counseling at [facility]. Ingrown hair seen in 6/16 resolved.No other health issues today.  No LMP recorded. Patient is not currently having periods (Reason: IUD).          Sexually active: Yes.    The current method of family planning is IUD.    Exercising: Yes.    Walking, yoga Smoker:  Yes, rarely   Health Maintenance: Pap:  03/06/14 Neg MMG:  01/30/15 BIRADS1:neg density B  Self Breast Check: No Colonoscopy:  Never BMD:   Never TDaP:  2013 Labs: PCP   reports that she has been smoking Cigarettes.  She has never used smokeless tobacco. She reports that she drinks about 1.8 - 3.0 oz of alcohol per week. She reports that she does not use illicit drugs.  Past Medical History  Diagnosis Date  . Anxiety     with menses  . Anemia   . Depression     menses related  . Gestational diabetes     Past Surgical History  Procedure Laterality Date  . Iud removal      mirena removed 8/14  . Intrauterine device (iud) insertion      mirena inserted 09-12-13    Current Outpatient Prescriptions  Medication Sig Dispense Refill  . cholecalciferol (VITAMIN D) 1000 UNITS tablet Take 1,000 Units by mouth daily.    . clonazePAM (KLONOPIN) 1 MG tablet Take 1 tablet (1 mg total) by mouth daily. PRN 30 tablet 1  . fluticasone (FLONASE) 50 MCG/ACT nasal spray as needed.    . Levonorgestrel (MIRENA IU) by Intrauterine route. Was placed 09/11/14    . Multiple Vitamins-Minerals (MULTIVITAMIN PO) Take by mouth daily.     No current facility-administered medications for this visit.    Family History  Problem Relation Age of Onset  . Diabetes Mother   . Cancer Mother     lung  . Hypertension Mother   . Heart disease Mother   . Depression Mother     personality disorder  . Hypertension  Father   . Cancer Father     pancreatic  . Heart disease Father   . Hyperlipidemia Father   . Cancer Maternal Grandmother     colon  . Stroke Maternal Grandmother   . Cancer Cousin     breast    ROS:  Pertinent items are noted in HPI.  Otherwise, a comprehensive ROS was negative.  Exam:   BP 112/70 mmHg  Pulse 64  Resp 18  Ht 5\' 1"  (1.549 m)  Wt 142 lb (64.411 kg)  BMI 26.84 kg/m2 Height: 5\' 1"  (154.9 cm) Ht Readings from Last 3 Encounters:  03/08/15 5\' 1"  (1.549 m)  01/25/15 5' 1.5" (1.562 m)  08/23/14 5' 1.5" (1.562 m)    General appearance: alert, cooperative and appears stated age Head: Normocephalic, without obvious abnormality, atraumatic Neck: no adenopathy, supple, symmetrical, trachea midline and thyroid normal to inspection and palpation Lungs: clear to auscultation bilaterally Breasts: normal appearance, no masses or tenderness, No nipple retraction or dimpling, No nipple discharge or bleeding, No axillary or supraclavicular adenopathy Heart: regular rate and rhythm Abdomen: soft, non-tender; no masses,  no organomegaly Extremities: extremities normal, atraumatic, no cyanosis or edema Skin: Skin color, texture, turgor normal. No rashes or lesions Lymph nodes: Cervical, supraclavicular, and axillary nodes normal. No abnormal  inguinal nodes palpated Neurologic: Grossly normal   Pelvic: External genitalia:  no lesions              Urethra:  normal appearing urethra with no masses, tenderness or lesions              Bartholin's and Skene's: normal                 Vagina: normal appearing vagina with normal color and discharge, no lesions              Cervix: normal,non tender,IUD string noted.              Pap taken: No. Bimanual Exam:  Uterus:  normal size, contour, position, consistency, mobility, non-tender              Adnexa: normal adnexa and no mass, fullness, tenderness               Rectovaginal: Confirms               Anus:  normal appearance,  patient declined exam    A:  Well Woman with normal exam  Contraception Mirena IUD  Anxiety/depression with PCP management, counseling planned  P:   Reviewed health and wellness pertinent to exam  Aware of warning signs with IUD and will advise if problems, due for removal 2020  Continue follow up as indicated.  Pap smear as above, not taken today   counseled on breast self exam, mammography screening, STD prevention, HIV risk factors and prevention, adequate intake of calcium and vitamin D, diet and exercise  return annually or prn  An After Visit Summary was printed and given to the patient.

## 2015-03-26 ENCOUNTER — Encounter: Payer: Self-pay | Admitting: Cardiology

## 2015-03-26 ENCOUNTER — Ambulatory Visit (INDEPENDENT_AMBULATORY_CARE_PROVIDER_SITE_OTHER): Payer: BLUE CROSS/BLUE SHIELD | Admitting: Cardiology

## 2015-03-26 VITALS — BP 130/68 | HR 73 | Ht 61.0 in | Wt 144.8 lb

## 2015-03-26 DIAGNOSIS — R002 Palpitations: Secondary | ICD-10-CM | POA: Insufficient documentation

## 2015-03-26 NOTE — Progress Notes (Signed)
Cardiology Office Note   Date:  03/26/2015   ID:  Debbie Cline, DOB [birth year], MRN [id number]  PCP:  Nadean Corwin, MD    Chief Complaint  Patient presents with  . New Evaluation    palpitations      History of Present Illness: Debbie Cline is a 44 y.o. female who presents for evaluation of palpitations.  She has been having problems with palpitations for the past few years.  She has been under a lot of stress with both her parents getting ill and then dying.  To ease her stress she started drinking each night after her kids go to bed.  She now drinks 3/4 of a bottle of wine nightly and smokes 4 cigarettes nightly.  She denies any chest pain, SOB, DOE, LE edema, dizziness or syncope.  The palpitations feel like a fluttering in her chest are are very erratic in occurrence.      Past Medical History  Diagnosis Date  . Anxiety     with menses  . Anemia   . Depression     menses related  . Gestational diabetes     Past Surgical History  Procedure Laterality Date  . Iud removal      mirena removed 8/14  . Intrauterine device (iud) insertion      mirena inserted 09-12-13     Current Outpatient Prescriptions  Medication Sig Dispense Refill  . cholecalciferol (VITAMIN D) 1000 UNITS tablet Take 1,000 Units by mouth daily.    . clonazePAM (KLONOPIN) 1 MG tablet Take 1 tablet (1 mg total) by mouth daily. PRN 30 tablet 1  . fluticasone (FLONASE) 50 MCG/ACT nasal spray Place 1 spray into both nostrils as needed for allergies or rhinitis.    . Levonorgestrel (MIRENA IU) by Intrauterine route. Was placed 09/11/14    . Multiple Vitamins-Minerals (MULTIVITAMIN PO) Take by mouth daily.     No current facility-administered medications for this visit.    Allergies:   Erythromycin    Social History:  The patient  reports that she has been smoking Cigarettes.  She has never used smokeless tobacco. She reports that she drinks about 1.8 - 3.0 oz of alcohol  per week. She reports that she does not use illicit drugs.   Family History:  The patient's family history includes Cancer in her cousin, father, maternal grandmother, and mother; Depression in her mother; Diabetes in her mother; Heart disease in her father and mother; Hyperlipidemia in her father; Hypertension in her father and mother; Stroke in her maternal grandmother.    ROS:  Please see the history of present illness.   Otherwise, review of systems are positive for none.   All other systems are reviewed and negative.    PHYSICAL EXAM: VS:  BP 130/68 mmHg  Pulse 73  Ht  (1.549 m)  Wt 144 lb 12.8 oz (65.681 kg)  BMI 27.37 kg/m2  SpO2 99% , BMI Body mass index is 27.37 kg/(m^2). GEN: Well nourished, well developed, in no acute distress HEENT: normal Neck: no JVD, carotid bruits, or masses Cardiac: RRR; no murmurs, rubs, or gallops,no edema  Respiratory:  clear to auscultation bilaterally, normal work of breathing GI: soft, nontender, nondistended, + BS MS: no deformity or atrophy Skin: warm and dry, no rash Neuro:  Strength and sensation are intact Psych: euthymic mood, full affect   EKG:  EKG is ordered  today. The ekg ordered today demonstrates NSR with normal intervals   Recent Labs: 06/19/2014: Magnesium 2.2; TSH 1.302 07/06/2014: ALT 10; BUN 13; Creat 0.62; Hemoglobin 15.0; Platelets 330; Potassium 4.1; Sodium 136    Lipid Panel    Component Value Date/Time   CHOL 188 06/19/2014 1026   TRIG 54 06/19/2014 1026   HDL 91 06/19/2014 1026   CHOLHDL 2.1 06/19/2014 1026   VLDL 11 06/19/2014 1026   LDLCALC 86 06/19/2014 1026      Wt Readings from Last 3 Encounters:  03/26/15 144 lb 12.8 oz (65.681 kg)  03/08/15 142 lb (64.411 kg)  01/25/15 142 lb 9.6 oz (64.683 kg)         ASSESSMENT AND PLAN:  1.  Palpitations - I will get a 30 day event monitor to assess. These may be related to stress and also ETOH 2.  ETOH abuse - she drinks 3/4 of a bottle of wine  nightly.  I have encouraged her to stop the alcohol.  She is seeing a Warden/ranger.  We discussed the effects alcohol can have on the heart including arrhythmias and DCM. 3.  Tobacco use - only when drinking alcohol.  I have encouraged her to start to cut back and get off cigarettes.    Current medicines are reviewed at length with the patient today.  The patient does not have concerns regarding medicines.  The following changes have been made:  no change  Labs/ tests ordered today: See above Assessment and Plan No orders of the defined types were placed in this encounter.     Disposition:   FU with me PRN pending results of studies   SignedQuintella Reichert, MD  03/26/2015 11:42 AM    [facility] [address] [place], [place], [place]  [address] Phone: [phone]; Fax: [phone]

## 2015-03-26 NOTE — Patient Instructions (Signed)
Medication Instructions:  Your physician recommends that you continue on your current medications as directed. Please refer to the Current Medication list given to you today.   Labwork: None  Testing/Procedures: Your physician has recommended that you wear an event monitor. Event monitors are medical devices that record the heart's electrical activity. Doctors most often us these monitors to diagnose arrhythmias. Arrhythmias are problems with the speed or rhythm of the heartbeat. The monitor is a small, portable device. You can wear one while you do your normal daily activities. This is usually used to diagnose what is causing palpitations/syncope (passing out).  Follow-Up: Your physician recommends that you schedule a follow-up appointment AS NEEDED with Dr. Turner pending your monitor results.  Any Other Special Instructions Will Be Listed Below (If Applicable). 

## 2015-03-30 ENCOUNTER — Ambulatory Visit (INDEPENDENT_AMBULATORY_CARE_PROVIDER_SITE_OTHER): Payer: BLUE CROSS/BLUE SHIELD

## 2015-03-30 DIAGNOSIS — R002 Palpitations: Secondary | ICD-10-CM

## 2015-04-04 ENCOUNTER — Telehealth: Payer: Self-pay | Admitting: Cardiology

## 2015-04-04 NOTE — Telephone Encounter (Signed)
New message    Pt states "there is no way she is going to wear the heart monitor" Pt believes it was causing more stress to wear the monitor Please call to discuss

## 2015-04-04 NOTE — Telephone Encounter (Signed)
Patient refuses to wear monitor anymore, even after firm encouragement.  Patient to send monitor back.    To monitor techs.

## 2015-06-18 ENCOUNTER — Encounter: Payer: Self-pay | Admitting: Internal Medicine

## 2015-07-04 ENCOUNTER — Encounter: Payer: Self-pay | Admitting: Internal Medicine

## 2015-07-23 ENCOUNTER — Encounter: Payer: Self-pay | Admitting: *Deleted

## 2015-11-14 ENCOUNTER — Ambulatory Visit (INDEPENDENT_AMBULATORY_CARE_PROVIDER_SITE_OTHER): Payer: BLUE CROSS/BLUE SHIELD | Admitting: Certified Nurse Midwife

## 2015-11-14 ENCOUNTER — Encounter: Payer: Self-pay | Admitting: Certified Nurse Midwife

## 2015-11-14 VITALS — BP 114/80 | HR 70 | Resp 16 | Ht 61.0 in | Wt 141.0 lb

## 2015-11-14 DIAGNOSIS — N63 Unspecified lump in breast: Secondary | ICD-10-CM | POA: Diagnosis not present

## 2015-11-14 DIAGNOSIS — N6310 Unspecified lump in the right breast, unspecified quadrant: Secondary | ICD-10-CM

## 2015-11-14 NOTE — Progress Notes (Signed)
Scheduled patient while in office for right breast diagnostic mammogram with ultrasound at [facility] on 11/15/2015 at 10:15 am. She is agreeable to date and time. Placed in mammogram hold. 6 week breast recheck scheduled for 12/27/2015 at 10 am with Leota Sauerseborah Leonard CNM. She is agreeable to date and time.

## 2015-11-14 NOTE — Progress Notes (Signed)
   Subjective:   45 y.o. Divorced Caucasian female presents for evaluation of right breast mass. Onset of the symptoms was one week. Patient had also noted left breast appeared smaller. Patient sought evaluation because of breast lump.  Contributing factors include none. Denies any nipple discharge, skin change or tenderness or redness.. Patient denies hiistory of trauma, bites, or injuries. Last mammogram was 01/30/16.  Review of Systems Pertinent items are noted in HPI.   Objective:   General appearance: alert, cooperative and appears stated age Breasts: normal appearance, no masses or tenderness, No nipple retraction or dimpling, No nipple discharge or bleeding, No axillary or supraclavicular adenopathy, positive findings: mass noted in right breast at 5 o'clock 2 fb from areola    Assessment:   ASSESSMENT:Patient is diagnosed with right mass    Plan:   PLAN: Discussed finding of right breast mass and need for evaluation with US and diagnostic mammogram. Will be scheduled prior to leaving today. Questions addressed.

## 2015-11-22 ENCOUNTER — Other Ambulatory Visit: Payer: Self-pay | Admitting: Internal Medicine

## 2015-11-22 NOTE — Progress Notes (Signed)
Encounter reviewed Debbie Hepburn, MD   

## 2015-12-17 ENCOUNTER — Ambulatory Visit (INDEPENDENT_AMBULATORY_CARE_PROVIDER_SITE_OTHER): Payer: 59 | Admitting: Podiatry

## 2015-12-17 ENCOUNTER — Encounter: Payer: Self-pay | Admitting: Podiatry

## 2015-12-17 ENCOUNTER — Ambulatory Visit (INDEPENDENT_AMBULATORY_CARE_PROVIDER_SITE_OTHER): Payer: 59

## 2015-12-17 VITALS — BP 135/87 | HR 71 | Resp 16

## 2015-12-17 DIAGNOSIS — M79675 Pain in left toe(s): Secondary | ICD-10-CM | POA: Diagnosis not present

## 2015-12-17 DIAGNOSIS — B351 Tinea unguium: Secondary | ICD-10-CM | POA: Diagnosis not present

## 2015-12-17 DIAGNOSIS — M779 Enthesopathy, unspecified: Secondary | ICD-10-CM | POA: Diagnosis not present

## 2015-12-17 MED ORDER — DICLOFENAC SODIUM 75 MG PO TBEC: 75.0000 mg | DELAYED_RELEASE_TABLET | Freq: Two times a day (BID) | ORAL | Status: DC

## 2015-12-17 NOTE — Progress Notes (Signed)
   Subjective:    Patient ID: Debbie Cline, female    DOB: June 26, 1971, 45 y.o.   MRN: [id number]  HPI    Review of Systems  All other systems reviewed and are negative.      Objective:   Physical Exam        Assessment & Plan:

## 2015-12-18 NOTE — Progress Notes (Signed)
Subjective:     Patient ID: Debbie Cline, female   DOB: 03-24-1971, 45 y.o.   MRN: [id number]  HPI patient presents with distal discoloration of the hallux nails left over right with inflammation and pain around the second metatarsophalangeal joint left over right foot with fluid buildup noted. Patient states the nails have been this way for several years and have turned darker in the last 6 months   Review of Systems  All other systems reviewed and are negative.      Objective:   Physical Exam  Constitutional: She is oriented to person, place, and time.  Cardiovascular: Intact distal pulses.   Musculoskeletal: Normal range of motion.  Neurological: She is oriented to person, place, and time.  Skin: Skin is warm.  Nursing note and vitals reviewed.  neurovascular status intact muscle strength adequate range of motion within normal limits with patient found to have distal discoloration of the hallux nail left over right with dark brown like discoloration and flaking. 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 At this point I infiltrated the left hallux 60 Milligan times like Marcaine mixture remove the distal one eighth of the nailbed left and I sent off for pathological and fungal culture evaluation. I then went ahead reviewed her x-rays and applied padding to take pressure off the joint surface second MPJ we will see results and decide whether laser and oral antifungal would be of benefit  X-ray report indicated no indications of stress fracture or advanced arthritis

## 2015-12-26 ENCOUNTER — Telehealth: Payer: Self-pay | Admitting: Certified Nurse Midwife

## 2015-12-26 NOTE — Telephone Encounter (Signed)
Patient canceled her appointment for breast recheck. She states she had a mammogram and everything was fine so she will wait until her annual in August to come in.

## 2015-12-26 NOTE — Telephone Encounter (Signed)
Dr. Hyacinth MeekerMiller,  Please review and advise.  Patient seen for diagnostic mammogram of R Breast [facility]. Birads 1-Negative with recall in 6 months to place her back on routine scheduling.  04 Recall is entered for 6 months.   Patient has annual exam scheduled for 03/12/16.   Okay to close encounter or needs breast check letter?  Hard copy of mammogram to Dr. Rondel BatonMiller's desk.  6 month recall entered.

## 2015-12-27 ENCOUNTER — Ambulatory Visit: Payer: BLUE CROSS/BLUE SHIELD | Admitting: Certified Nurse Midwife

## 2016-01-01 NOTE — Telephone Encounter (Signed)
Was this decided by Dr. Hyacinth MeekerMiller?

## 2016-01-03 ENCOUNTER — Telehealth: Payer: Self-pay | Admitting: *Deleted

## 2016-01-03 NOTE — Telephone Encounter (Signed)
See previous phone encounter. Palpable breast lump with negative imagine. MD recommends follow-up exam.  Call to patient, left message to call back.

## 2016-01-03 NOTE — Telephone Encounter (Addendum)
Encounter closed in error. Call to patient. Left message to call back. See next encounter.

## 2016-01-03 NOTE — Telephone Encounter (Signed)
Breast lump with negative imaging should have follow up breast exam.  Ok with change in recall.  Please call pt and see if she will return for repeat breast check.

## 2016-01-04 NOTE — Telephone Encounter (Signed)
Patient returned call. Advised of MD recommendation for follow-up of all palpable breast mass despite negative imaging. Important to monitor for change of the mass and may require second opinion with breast surgeon. Appointment scheduled for 01-24-16 with Debbi Darcel BayleyLeonard. Patient declines earlier appointment due to travel plans.  Routing to provider for final review. Patient agreeable to disposition. Will close encounter.

## 2016-01-21 ENCOUNTER — Ambulatory Visit: Payer: 59 | Admitting: Podiatry

## 2016-01-24 ENCOUNTER — Encounter: Payer: Self-pay | Admitting: Certified Nurse Midwife

## 2016-01-24 ENCOUNTER — Ambulatory Visit (INDEPENDENT_AMBULATORY_CARE_PROVIDER_SITE_OTHER): Payer: 59 | Admitting: Certified Nurse Midwife

## 2016-01-24 VITALS — BP 118/80 | HR 68 | Resp 16 | Ht 61.0 in | Wt 145.0 lb

## 2016-01-24 DIAGNOSIS — N63 Unspecified lump in breast: Secondary | ICD-10-CM | POA: Diagnosis not present

## 2016-01-24 DIAGNOSIS — N6310 Unspecified lump in the right breast, unspecified quadrant: Secondary | ICD-10-CM

## 2016-01-24 NOTE — Progress Notes (Signed)
    Subjective:   45 y.57o. DivorcedCaucasian female presents for follow up of right breast mass noted on 11/14/15 Patient had diagnostic mammogram on 11/15/15 with fibroglandular tissue noted in area of concern.Patient has been doing SBE and has not noted any changes since last visit of mammogram. Has decreased caffeine in diet. Patient to have follow mammogram in 6 months which would be her annual to re-evaluate. Review of Systems Pertinent items are noted in HPI. Objective:   General appearance: alert, cooperative and appears stated age Breasts: normal appearance, no masses or tenderness, No nipple retraction or dimpling, No nipple discharge or bleeding, No axillary or supraclavicular adenopathy, no masses noted in area of concern in right breast now  Assessment:   ASSESSMENT:Patient is diagnosed with normal breast exam with normal mammogram and us with fibroglandular density noted. Plan:   PLAN: Repeat mammogram in 10/17 and continue SBE monthly and advise if change.

## 2016-01-25 ENCOUNTER — Encounter: Payer: Self-pay | Admitting: Certified Nurse Midwife

## 2016-01-25 NOTE — Progress Notes (Signed)
Reviewed personally.  M. Suzanne Lorri Fukuhara, MD.  

## 2016-01-31 ENCOUNTER — Ambulatory Visit (INDEPENDENT_AMBULATORY_CARE_PROVIDER_SITE_OTHER): Payer: 59 | Admitting: Podiatry

## 2016-01-31 ENCOUNTER — Encounter: Payer: Self-pay | Admitting: Podiatry

## 2016-01-31 DIAGNOSIS — M779 Enthesopathy, unspecified: Secondary | ICD-10-CM

## 2016-01-31 DIAGNOSIS — B351 Tinea unguium: Secondary | ICD-10-CM

## 2016-01-31 MED ORDER — TERBINAFINE HCL 250 MG PO TABS: ORAL_TABLET | ORAL | Status: DC

## 2016-01-31 MED ORDER — MELOXICAM 15 MG PO TABS: 15.0000 mg | ORAL_TABLET | Freq: Every day | ORAL | Status: DC

## 2016-02-01 NOTE — Progress Notes (Signed)
Subjective:     Patient ID: Debbie Cline, female   DOB: 1970-08-22, 45 y.o.   MRN: [id number]  HPI patient states she still getting pain in her feet especially the left one and also she does have nail disease that we reviewed and are going to discuss   Review of Systems     Objective:   Physical Exam Neurovascular status found to be intact with muscle strength adequate range of motion within normal limits with pain in the metatarsal phalangeal joints 234 both feet left over right and also noted to have nail disease of the hallux second nail left and slightly hallux nail right with discoloration of the distal one third of the nailbed that's    Assessment:     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 Scanned for custom orthotics to reduce plantar pressure on her feet

## 2016-03-04 ENCOUNTER — Ambulatory Visit: Payer: 59 | Admitting: *Deleted

## 2016-03-04 DIAGNOSIS — M779 Enthesopathy, unspecified: Secondary | ICD-10-CM

## 2016-03-04 NOTE — Progress Notes (Signed)
Patient ID: Debbie Cline, female   DOB: Dec 16, 1970, 45 y.o.   MRN: [id number] Patient presents for orthotic pick up.  Verbal and written break in and wear instructions given.  Patient will follow up in 4 weeks if symptoms worsen or fail to improve.

## 2016-03-04 NOTE — Patient Instructions (Signed)

## 2016-03-12 ENCOUNTER — Ambulatory Visit (INDEPENDENT_AMBULATORY_CARE_PROVIDER_SITE_OTHER): Payer: 59 | Admitting: Certified Nurse Midwife

## 2016-03-12 ENCOUNTER — Encounter: Payer: Self-pay | Admitting: Certified Nurse Midwife

## 2016-03-12 VITALS — BP 102/70 | HR 70 | Resp 16 | Ht 61.0 in | Wt 139.0 lb

## 2016-03-12 DIAGNOSIS — Z124 Encounter for screening for malignant neoplasm of cervix: Secondary | ICD-10-CM

## 2016-03-12 DIAGNOSIS — Z01419 Encounter for gynecological examination (general) (routine) without abnormal findings: Secondary | ICD-10-CM

## 2016-03-12 DIAGNOSIS — Z0000 Encounter for general adult medical examination without abnormal findings: Secondary | ICD-10-CM | POA: Diagnosis not present

## 2016-03-12 LAB — POCT URINALYSIS DIPSTICK
Bilirubin, UA: NEGATIVE
Blood, UA: NEGATIVE
Glucose, UA: NEGATIVE
Ketones, UA: NEGATIVE
Leukocytes, UA: NEGATIVE
Nitrite, UA: NEGATIVE
Protein, UA: NEGATIVE
Urobilinogen, UA: NEGATIVE
pH, UA: 5

## 2016-03-12 LAB — HEMOGLOBIN, FINGERSTICK: Hemoglobin, fingerstick: 14.7 g/dL (ref 12.0–16.0)

## 2016-03-12 LAB — LIPID PANEL
Cholesterol: 163 mg/dL (ref 125–200)
HDL: 81 mg/dL (ref >=46)
LDL Cholesterol: 69 mg/dL (ref <130)
Total CHOL/HDL Ratio: 2 Ratio (ref <=5.0)
Triglycerides: 66 mg/dL (ref <150)
VLDL: 13 mg/dL (ref <30)

## 2016-03-12 NOTE — Patient Instructions (Signed)

## 2016-03-12 NOTE — Progress Notes (Signed)
45 y.o. [id number] Divorced  Caucasian Fe here for annual exam. Periods occasionally, usually none.. Sexually active no STD concerns. Working on specific carbohydrate diet, and this is working well with weight loss.  Desires screening labs today. Sees Urgent care if needed. Alison StallingMarley will be a senior this year!.  No LMP recorded. Patient is not currently having periods (Reason: IUD).         02/16/16 one in the past year Sexually active: Yes.    The current method of family planning is IUD.    Exercising: Yes.    walk, yoga,gym Smoker:  no  Health Maintenance: Pap:  03-06-14 neg MMG:  Bilateral was 01-30-15, 11-15-15 rt breast category 1 6mth f/u recommended Colonoscopy:  none BMD:   none TDaP:  2013 Shingles: no Pneumonia: no Hep C and HIV: HIV checked during pregnancy yrs ago ZOX:WRUELab:poct urine-neg, hgb-14.7 Self breast exam: done occ   reports that she has been smoking Cigarettes.  She has never used smokeless tobacco. She reports that she drinks about 1.8 - 3.0 oz of alcohol per week . She reports that she does not use drugs.  Past Medical History:  Diagnosis Date  . Anemia   . Anxiety    with menses  . Depression    menses related  . Gestational diabetes     Past Surgical History:  Procedure Laterality Date  . INTRAUTERINE DEVICE (IUD) INSERTION     mirena inserted 09-12-13  . IUD REMOVAL     mirena removed 8/14    Current Outpatient Prescriptions  Medication Sig Dispense Refill  . clonazePAM (KLONOPIN) 0.5 MG tablet TAKE ONE-HALF TABLET AS NEEDED  1  . diclofenac (VOLTAREN) 75 MG EC tablet Take 1 tablet (75 mg total) by mouth 2 (two) times daily. 50 tablet 2  . fexofenadine (ALLEGRA) 180 MG tablet Take 180 mg by mouth daily.    . Levonorgestrel (MIRENA IU) by Intrauterine route. Was placed 09/11/14    . meloxicam (MOBIC) 15 MG tablet Take 1 tablet (15 mg total) by mouth daily. 30 tablet 2  . Multiple Vitamins-Minerals (MULTIVITAMIN PO) Take by mouth daily.    Marland Kitchen. terbinafine  (LAMISIL) 250 MG tablet Take one tablet once daily x 7 days, then repeat every month x 4 months 28 tablet 0   No current facility-administered medications for this visit.     Family History  Problem Relation Age of Onset  . Diabetes Mother   . Cancer Mother     lung  . Hypertension Mother   . Heart disease Mother   . Depression Mother     personality disorder  . Hypertension Father   . Cancer Father     pancreatic  . Heart disease Father   . Hyperlipidemia Father   . Cancer Maternal Grandmother     colon  . Stroke Maternal Grandmother   . Cancer Cousin     breast    ROS:  Pertinent items are noted in HPI.  Otherwise, a comprehensive ROS was negative.  Exam:   There were no vitals taken for this visit.   Ht Readings from Last 3 Encounters:  01/24/16 5\' 1"  (1.549 m)  11/14/15 5\' 1"  (1.549 m)  03/26/15 5\' 1"  (1.549 m)    General appearance: alert, cooperative and appears stated age Head: Normocephalic, without obvious abnormality, atraumatic Neck: no adenopathy, supple, symmetrical, trachea midline and thyroid normal to inspection and palpation Lungs: clear to auscultation bilaterally Breasts: normal appearance, no masses or tenderness, No nipple  retraction or dimpling, No nipple discharge or bleeding, No axillary or supraclavicular adenopathy Heart: regular rate and rhythm Abdomen: soft, non-tender; no masses,  no organomegaly Extremities: extremities normal, atraumatic, no cyanosis or edema Skin: Skin color, texture, turgor normal. No rashes or lesions Lymph nodes: Cervical, supraclavicular, and axillary nodes normal. No abnormal inguinal nodes palpated Neurologic: Grossly normal   Pelvic: External genitalia:  no lesions              Urethra:  normal appearing urethra with no masses, tenderness or lesions              Bartholin's and Skene's: normal                 Vagina: normal appearing vagina with normal color and discharge, no lesions              Cervix:  multiparous appearance, no cervical motion tenderness and no lesions              Pap taken: Yes.   Bimanual Exam:  Uterus:  normal size, contour, position, consistency, mobility, non-tender              Adnexa: normal adnexa and no mass, fullness, tenderness               Rectovaginal: Confirms               Anus:  normal sphincter tone, no lesions  Chaperone present: yes  A:  Well Woman with normal exam  Contraception Mirena IUD removal 2/20  Screening labs  P:   Reviewed health and wellness pertinent to exam  Reviewed warning signs and need to advise  Labs: TSH,Vit. D, Lipid panel  Pap smear as above with HPVHR   counseled on breast self exam, mammography screening, STD prevention, HIV risk factors and prevention, adequate intake of calcium and vitamin D, diet and exercise  return annually or prn  An After Visit Summary was printed and given to the patient.

## 2016-03-13 LAB — VITAMIN D 25 HYDROXY (VIT D DEFICIENCY, FRACTURES): Vit D, 25-Hydroxy: 37 ng/mL (ref 30–100)

## 2016-03-15 NOTE — Progress Notes (Signed)
Encounter reviewed Jill Jertson, MD   

## 2016-03-18 LAB — IPS PAP TEST WITH HPV

## 2016-05-20 ENCOUNTER — Encounter: Payer: Self-pay | Admitting: Certified Nurse Midwife

## 2016-06-18 ENCOUNTER — Encounter: Payer: Self-pay | Admitting: Internal Medicine

## 2016-07-03 ENCOUNTER — Other Ambulatory Visit: Payer: Self-pay | Admitting: *Deleted

## 2016-07-03 ENCOUNTER — Ambulatory Visit (INDEPENDENT_AMBULATORY_CARE_PROVIDER_SITE_OTHER): Payer: 59 | Admitting: Certified Nurse Midwife

## 2016-07-03 ENCOUNTER — Encounter: Payer: Self-pay | Admitting: Certified Nurse Midwife

## 2016-07-03 VITALS — BP 122/80 | HR 70 | Temp 98.8°F | Resp 16 | Ht 61.0 in | Wt 134.0 lb

## 2016-07-03 DIAGNOSIS — G8929 Other chronic pain: Secondary | ICD-10-CM

## 2016-07-03 DIAGNOSIS — R102 Pelvic and perineal pain: Secondary | ICD-10-CM

## 2016-07-03 DIAGNOSIS — Z3049 Encounter for surveillance of other contraceptives: Secondary | ICD-10-CM | POA: Diagnosis not present

## 2016-07-03 DIAGNOSIS — R1032 Left lower quadrant pain: Secondary | ICD-10-CM

## 2016-07-03 LAB — CBC WITH DIFFERENTIAL/PLATELET
Basophils Absolute: 0 cells/uL (ref 0–200)
Basophils Relative: 0 %
Eosinophils Absolute: 50 cells/uL (ref 15–500)
Eosinophils Relative: 1 %
HCT: 43.3 % (ref 35.0–45.0)
Hemoglobin: 14.4 g/dL (ref 11.7–15.5)
Lymphocytes Relative: 39 %
Lymphs Abs: 1950 cells/uL (ref 850–3900)
MCH: 32.4 pg (ref 27.0–33.0)
MCHC: 33.3 g/dL (ref 32.0–36.0)
MCV: 97.3 fL (ref 80.0–100.0)
MPV: 9.5 fL (ref 7.5–12.5)
Monocytes Absolute: 550 cells/uL (ref 200–950)
Monocytes Relative: 11 %
Neutro Abs: 2450 cells/uL (ref 1500–7800)
Neutrophils Relative %: 49 %
Platelets: 375 10*3/uL (ref 140–400)
RBC: 4.45 MIL/uL (ref 3.80–5.10)
RDW: 13 % (ref 11.0–15.0)
WBC: 5 10*3/uL (ref 3.8–10.8)

## 2016-07-03 LAB — POCT URINALYSIS DIPSTICK
Bilirubin, UA: NEGATIVE
Blood, UA: NEGATIVE
Glucose, UA: NEGATIVE
Ketones, UA: NEGATIVE
Leukocytes, UA: NEGATIVE
Nitrite, UA: NEGATIVE
Protein, UA: NEGATIVE
Urobilinogen, UA: NEGATIVE
pH, UA: 5

## 2016-07-03 NOTE — Patient Instructions (Signed)

## 2016-07-03 NOTE — Progress Notes (Signed)
Encounter reviewed Jill Jertson, MD   

## 2016-07-03 NOTE — Progress Notes (Signed)
45 y.o. Divorced Caucasian female [id number] here with complaint of left lower quadrant pain that she has had for years and always attributed to ovulation and period. Feels this has increased and is a concern. She recently sought advice from a ? "energy healer with hair analysis" and was told something is going on with left ovary. Ovarian cancer is her concern. She has reviewed the symptoms of ovarian cancer feels she has these. Feels like the left side pain has increased and comes and goes, sharp sporadic pain. Also history of IBS and has increase of symptoms and needs help with. Denies blood in stool, but goes from loose stools, diarrhea to constipation. Works on gluten free diet with some change only. No other concerns today.  O:Healthy female WDWN, no distress Affect: normal, orientation x 3  Exam:Skin warm and dry, color good,  Abdomen:soft, normal bowel sounds all 4 quads, no rebound, points to LLQ as point of pain when occurs  Inguinal Lymph nodes: no enlargement or tenderness Pelvic exam: External genital: normal female, no scaling or exudate or lesions BUS: negative, Bladder non tender, urethral meatus non tender  Vagina: scant clear non odorous discharge noted. prep taken, Affirm taken Cervix: normal, non tender, no CMT, IUD string noted in cervix Uterus: normal, non tender Adnexa:normal, non tender, no masses or fullness noted, tries to guard left side at times, but complaining of no pain at this point   A:Normal pelvic exam LLQ pain ? Ovarian vs IBS origin Chronic history of IBS, not controlled R/O vaginal infection and STD Normal Mirena IUD surveillance   P:Discussed findings of normal exam, other than slight guarding on left with anticipation of pain. Discussed  Normal abdominal exam at present. Discussed ovarian cancer, limited screening with CA 125 and PUS. Discussed IBS related referred pain a possible occurrence but feel she needs well controlled IBS with medication. Recommend  referral to Dr. Loreta AveMann for evaluation, patient agreeable. Patient will be called with referral  information regarding appointment. Also discussed feel PUS needed to confirm normal findings and r/o any concerns. Patient agreeable and will be called with insurance infor and scheduled. Warning signs of abdominal and pelvic pain given.  LABS:  CBC with diff, GC,Chlamlydia, HIV, RPR, Hep C, will hold on CA125 until PUS done and if needed. Affirm Reassured Mirena IUD appears no change. Warning signs with IUD given and need to advise.   Rv prn

## 2016-07-04 LAB — HEPATITIS C ANTIBODY: HCV Ab: NEGATIVE

## 2016-07-04 LAB — HIV ANTIBODY (ROUTINE TESTING W REFLEX): HIV 1&2 Ab, 4th Generation: NONREACTIVE

## 2016-07-04 LAB — GC/CHLAMYDIA PROBE AMP
CT Probe RNA: NOT DETECTED
GC Probe RNA: NOT DETECTED

## 2016-07-04 LAB — WET PREP BY MOLECULAR PROBE
Candida species: NEGATIVE
Gardnerella vaginalis: NEGATIVE
Trichomonas vaginosis: NEGATIVE

## 2016-07-08 ENCOUNTER — Ambulatory Visit (INDEPENDENT_AMBULATORY_CARE_PROVIDER_SITE_OTHER): Payer: 59

## 2016-07-08 ENCOUNTER — Encounter: Payer: Self-pay | Admitting: Obstetrics and Gynecology

## 2016-07-08 ENCOUNTER — Ambulatory Visit (INDEPENDENT_AMBULATORY_CARE_PROVIDER_SITE_OTHER): Payer: 59 | Admitting: Obstetrics and Gynecology

## 2016-07-08 VITALS — BP 106/68 | HR 70 | Resp 16 | Ht 61.0 in | Wt 133.0 lb

## 2016-07-08 DIAGNOSIS — R102 Pelvic and perineal pain: Secondary | ICD-10-CM | POA: Diagnosis not present

## 2016-07-08 DIAGNOSIS — R198 Other specified symptoms and signs involving the digestive system and abdomen: Secondary | ICD-10-CM | POA: Diagnosis not present

## 2016-07-08 DIAGNOSIS — G8929 Other chronic pain: Secondary | ICD-10-CM

## 2016-07-08 DIAGNOSIS — R1032 Left lower quadrant pain: Secondary | ICD-10-CM

## 2016-07-08 DIAGNOSIS — R1030 Lower abdominal pain, unspecified: Secondary | ICD-10-CM

## 2016-07-08 NOTE — Progress Notes (Signed)
GYNECOLOGY  VISIT   HPI: 45 y.o.   Divorced  Caucasian  female   G2P2002 with No LMP recorded. Patient is not currently having periods (Reason: IUD).   here for  PUS/OV//jj. The patient was sent for evaluation by Sara Chuebbie Leonard, CNM for c/o a several year h/o worsening, intermittent LLQ abdominal pain.  Typically she gets the pain one x a month around ovulation. The pain is sharp and brief. Prior to November she was getting a light cycle every 4-6 weeks, only minimal spotting. In November she had 2 cycles, heavier than spotting, but still light. The first one lasted for 5 days, the last 13 days. In November she had episodes of shooting pain in the LLQ and the RLQ. Always seems to come a few days prior to her cycle.  She does have a h/o IBS, she is being set up to see Dr Loreta AveMann.  She goes from diarrhea to constipation. She can have 8 BM's a day, watery. She can go a few days with a normal BM daily. Then can go 2 days without a BM. No blood in her stool, no black tarry stools.   GYNECOLOGIC HISTORY: No LMP recorded. Patient is not currently having periods (Reason: IUD). Contraception:IUD Menopausal hormone therapy: none        OB History    Gravida Para Term Preterm AB Living   2 2 2     2    SAB TAB Ectopic Multiple Live Births                     Patient Active Problem List   Diagnosis Date Noted  . Heart palpitations 03/26/2015  . Gestational diabetes   . ABDOMINAL PAIN, PERIUMBILICAL 05/09/2008  . ANXIETY 03/24/2008  . ABDOMINAL BLOATING 03/24/2008  . Abdominal pain 03/24/2008    Past Medical History:  Diagnosis Date  . Anemia   . Anxiety    with menses  . Depression    menses related  . Gestational diabetes     Past Surgical History:  Procedure Laterality Date  . INTRAUTERINE DEVICE (IUD) INSERTION     mirena inserted 09-12-13  . IUD REMOVAL     mirena removed 8/14    Current Outpatient Prescriptions  Medication Sig Dispense Refill  . clonazePAM (KLONOPIN) 0.5 MG  tablet TAKE ONE-HALF TABLET AS NEEDED  1  . fexofenadine (ALLEGRA) 180 MG tablet Take 180 mg by mouth daily.    Marland Kitchen. FLUoxetine (PROZAC) 10 MG tablet Take 10 mg by mouth every other day.    . Levonorgestrel (MIRENA IU) by Intrauterine route. Was placed 09/11/14    . Multiple Vitamins-Minerals (MULTIVITAMIN PO) Take by mouth daily.     No current facility-administered medications for this visit.      ALLERGIES: Erythromycin  Family History  Problem Relation Age of Onset  . Diabetes Mother   . Cancer Mother     lung  . Hypertension Mother   . Heart disease Mother   . Depression Mother     personality disorder  . Hypertension Father   . Cancer Father     pancreatic  . Heart disease Father   . Hyperlipidemia Father   . Cancer Maternal Grandmother     colon  . Stroke Maternal Grandmother   . Cancer Cousin     breast    Social History   Social History  . Marital status: Divorced    Spouse name: N/A  . Number of children: N/A  .  Years of education: N/A   Occupational History  . Not on file.   Social History Main Topics  . Smoking status: Former Smoker    Types: Cigarettes  . Smokeless tobacco: Never Used  . Alcohol use 1.2 oz/week    2 Standard drinks or equivalent per week  . Drug use: No  . Sexual activity: Yes    Partners: Male    Birth control/ protection: IUD   Other Topics Concern  . Not on file   Social History Narrative  . No narrative on file    ROS  PHYSICAL EXAMINATION:    There were no vitals taken for this visit.    General appearance: alert, cooperative and appears stated age Abdomen: soft, non-tender; bowel sounds normal; no masses,  no organomegaly, not distended   ASSESSMENT Abdominal pain, mostly LLQ, last month episode of RLQ pain as well. The patient associates the pain with ovulation, but it occurs a few days prior to her cycles. Also reviewed that the hormonal changes of her cycle could be causing bowel changes that could cause  pain Diarrhea to constipation. She can have up to 8 watery BM's a day.     PLAN Reviewed the ultrasound images, normal, IUD in place Discussed keeping track of her pain, cycles and BM's She is following up with Dr Loreta AveMann Discussed dietary changes, she already is gluten and dairy free.    An After Visit Summary was printed and given to the patient.  Approximately 15 minutes face to face time of which over 50% was spent in counseling.

## 2016-12-29 ENCOUNTER — Telehealth: Payer: Self-pay | Admitting: *Deleted

## 2016-12-29 NOTE — Telephone Encounter (Addendum)
Pt states it has taken her a year to get started on the Lamisil and only got 7 pills and she was to take it for 4 months.01/02/2017-Left message informing pt that Dr. Charlsie Merlesegal is not understanding why only got the 7 instead of 28 pills.01/08/2017-Pt states she got 7 pills and has one refill. DR. Charlsie Merlesegal ordered pt may have the lamisil pill to complete a entire pulse pack in one month. I told pt that she needed to dispose of the Lamisil she has at this time, and I would call in a new pulse pack prescription. I explained to pt DR. Regal had ordered Lamisil pulse system for her, and it included Lamisil 250mg  #28, one pulse is one tablet daily for 7 days, then off 21 days, and repeat pulse 3 times. Pt states understanding. I asked pt why she had not taken the lamisil and waited a year to call and she states she had IBS, but is not having problems. I informed Dr. Charlsie Merlesegal of pt's IBS history and he stated there should be no problem with the lamisil pulse pack.

## 2016-12-29 NOTE — Telephone Encounter (Signed)
She should have received 28 pills for 4 months

## 2017-01-08 MED ORDER — TERBINAFINE HCL 250 MG PO TABS: ORAL_TABLET | ORAL | 0 refills | Status: DC

## 2017-01-27 ENCOUNTER — Other Ambulatory Visit: Payer: Self-pay

## 2017-03-17 ENCOUNTER — Ambulatory Visit: Payer: 59 | Admitting: Certified Nurse Midwife

## 2017-04-16 ENCOUNTER — Encounter: Payer: Self-pay | Admitting: Certified Nurse Midwife

## 2017-04-16 ENCOUNTER — Ambulatory Visit (INDEPENDENT_AMBULATORY_CARE_PROVIDER_SITE_OTHER): Payer: 59 | Admitting: Certified Nurse Midwife

## 2017-04-16 VITALS — BP 110/70 | HR 68 | Resp 16 | Ht 60.75 in | Wt 143.0 lb

## 2017-04-16 DIAGNOSIS — Z01419 Encounter for gynecological examination (general) (routine) without abnormal findings: Secondary | ICD-10-CM

## 2017-04-16 DIAGNOSIS — Z975 Presence of (intrauterine) contraceptive device: Secondary | ICD-10-CM | POA: Diagnosis not present

## 2017-04-16 NOTE — Patient Instructions (Signed)

## 2017-04-16 NOTE — Progress Notes (Signed)
46 y.o. G84P2002 Divorced  Caucasian Fe here for annual exam. Periods are sporadic with Mirena IUD. Denies warning signs of IUD. Happy with choice. Has established with new PCP Dr. Hal Hope, had all labs and will be managing her medications for depression/anxiety. Had colonoscopy and on IBS medication with GI management with good results. No partner change, no STD screening needed. No health issues today.  Patient's last menstrual period was 03/09/2017 (approximate).          Sexually active: Yes.    The current method of family planning is IUD.    Exercising: Yes.    yoga & walking Smoker:  yes  Health Maintenance: Pap:  03-12-16 WNL NEG HR HPV 03-06-14 WNL  History of Abnormal Pap: no MMG:  05-14-16 WNL Self Breast exams: yes-monthly Colonoscopy:  11-19-16 polyps  BMD:   Never TDaP:  2013 Shingles: Never Pneumonia: Never Hep C and HIV: 07-03-16 Labs: none needed Sees PCP   reports that she has quit smoking. Her smoking use included Cigarettes. She has never used smokeless tobacco. She reports that she drinks about 3.0 - 4.2 oz of alcohol per week . She reports that she does not use drugs.  Past Medical History:  Diagnosis Date  . Anemia   . Anxiety    with menses  . Depression    menses related  . Gestational diabetes     Past Surgical History:  Procedure Laterality Date  . INTRAUTERINE DEVICE (IUD) INSERTION     mirena inserted 09-12-13  . IUD REMOVAL     mirena removed 8/14    Current Outpatient Prescriptions  Medication Sig Dispense Refill  . clonazePAM (KLONOPIN) 0.5 MG tablet TAKE ONE-HALF TABLET AS NEEDED  1  . fexofenadine (ALLEGRA) 180 MG tablet Take 180 mg by mouth daily.    Marland Kitchen FLUoxetine (PROZAC) 10 MG tablet Take 10 mg by mouth every other day.    . lamoTRIgine (LAMICTAL) 25 MG tablet Take 25 mg by mouth daily.    . Levonorgestrel (MIRENA IU) by Intrauterine route. Was placed 09/11/14    . Multiple Vitamins-Minerals (MULTIVITAMIN PO) Take by mouth daily.    Marland Kitchen  terbinafine (LAMISIL) 250 MG tablet Take one tablet daily for 7 days, off for 3 weeks = one pulse, repeat 3 more pulses. 28 tablet 0   No current facility-administered medications for this visit.     Family History  Problem Relation Age of Onset  . Diabetes Mother   . Cancer Mother        lung  . Hypertension Mother   . Heart disease Mother   . Depression Mother        personality disorder  . Hypertension Father   . Cancer Father        pancreatic  . Heart disease Father   . Hyperlipidemia Father   . Cancer Maternal Grandmother        colon  . Stroke Maternal Grandmother   . Cancer Cousin        breast    ROS:  Pertinent items are noted in HPI.  Otherwise, a comprehensive ROS was negative.  Exam:   BP 110/70   Pulse 68   Resp 16   Ht 5' 0.75" (1.543 m)   Wt 143 lb (64.9 kg)   LMP 03/09/2017 (Approximate)   BMI 27.24 kg/m  Height: 5' 0.75" (154.3 cm) Ht Readings from Last 3 Encounters:  04/16/17 5' 0.75" (1.543 m)  07/08/16  (1.549 m)  07/03/16   (1.549 m)    General appearance: alert, cooperative and appears stated age Head: Normocephalic, without obvious abnormality, atraumatic Neck: no adenopathy, supple, symmetrical, trachea midline and thyroid normal to inspection and palpation Lungs: clear to auscultation bilaterally Breasts: normal appearance, no masses or tenderness, No nipple retraction or dimpling, No nipple discharge or bleeding, No axillary or supraclavicular adenopathy Heart: regular rate and rhythm Abdomen: soft, non-tender; no masses,  no organomegaly Extremities: extremities normal, atraumatic, no cyanosis or edema Skin: Skin color, texture, turgor normal. No rashes or lesions Lymph nodes: Cervical, supraclavicular, and axillary nodes normal. No abnormal inguinal nodes palpated Neurologic: Grossly normal   Pelvic: External genitalia:  no lesions              Urethra:  normal appearing urethra with no masses, tenderness or lesions               Bartholin's and Skene's: normal                 Vagina: normal appearing vagina with normal color and discharge, no lesions              Cervix: multiparous appearance, no cervical motion tenderness and no lesions  IUD string noted in cervix              Pap taken: No. Bimanual Exam:  Uterus:  normal size, contour, position, consistency, mobility, non-tender              Adnexa: normal adnexa and no mass, fullness, tenderness               Rectovaginal: Confirms               Anus:  normal sphincter tone, no lesions  Chaperone present: yes  A:  Well Woman with normal exam  Contraception Mirena IUD  IBS, Depression,anxiety with MD management    P:   Reviewed health and wellness pertinent to exam  Warning signs with IUD reviewed and need to advise  Continue follow up with MD as indicated  Pap smear: no   counseled on breast self exam, mammography screening, adequate intake of calcium and vitamin D, diet and exercise return annually or prn  An After Visit Summary was printed and given to the patient.

## 2017-05-06 ENCOUNTER — Telehealth: Payer: Self-pay | Admitting: *Deleted

## 2017-05-06 NOTE — Telephone Encounter (Addendum)
Refill request for terbinafine  #28 one tablet daily for 1 week, off 3 weeks for 1 cycle, repeat 3 times. 05/08/2017-Left message informing pt the pulse pack had been refilled by Dr. Charlsie Merles.

## 2017-05-08 MED ORDER — TERBINAFINE HCL 250 MG PO TABS: ORAL_TABLET | ORAL | 0 refills | Status: DC

## 2017-05-08 NOTE — Telephone Encounter (Signed)
yes

## 2017-05-28 ENCOUNTER — Encounter: Payer: Self-pay | Admitting: Certified Nurse Midwife

## 2018-01-11 ENCOUNTER — Ambulatory Visit: Payer: 59 | Admitting: Obstetrics and Gynecology

## 2018-01-11 ENCOUNTER — Encounter: Payer: Self-pay | Admitting: Obstetrics and Gynecology

## 2018-01-11 ENCOUNTER — Other Ambulatory Visit: Payer: Self-pay

## 2018-01-11 ENCOUNTER — Other Ambulatory Visit (HOSPITAL_COMMUNITY)
Admission: RE | Admit: 2018-01-11 | Discharge: 2018-01-11 | Disposition: A | Discharge status: Home / Self Care | Payer: 59 | Source: Ambulatory Visit | Attending: Obstetrics and Gynecology | Admitting: Obstetrics and Gynecology

## 2018-01-11 VITALS — BP 128/76 | HR 84 | Temp 97.6°F | Resp 16 | Ht 61.0 in | Wt 143.0 lb

## 2018-01-11 DIAGNOSIS — N760 Acute vaginitis: Secondary | ICD-10-CM | POA: Insufficient documentation

## 2018-01-11 DIAGNOSIS — R350 Frequency of micturition: Secondary | ICD-10-CM

## 2018-01-11 DIAGNOSIS — Z113 Encounter for screening for infections with a predominantly sexual mode of transmission: Secondary | ICD-10-CM | POA: Insufficient documentation

## 2018-01-11 DIAGNOSIS — R1032 Left lower quadrant pain: Secondary | ICD-10-CM | POA: Diagnosis not present

## 2018-01-11 LAB — POCT URINALYSIS DIPSTICK
Bilirubin, UA: NEGATIVE
Blood, UA: NEGATIVE
Glucose, UA: NEGATIVE
Ketones, UA: NEGATIVE
Leukocytes, UA: NEGATIVE
Nitrite, UA: NEGATIVE
Protein, UA: NEGATIVE
Urobilinogen, UA: 0.2 E.U./dL
pH, UA: 5 (ref 5.0–8.0)

## 2018-01-11 LAB — POCT URINE PREGNANCY: Preg Test, Ur: NEGATIVE

## 2018-01-11 NOTE — Progress Notes (Signed)
GYNECOLOGY  VISIT   HPI: 47 y.o.   Divorced  Caucasian  female   G2P2002 with Patient's last menstrual period was 12/21/2017 (within days).here for possible UTI. Patient complains of having symptoms of nausea, diarrhea, frequency of urination, and vaginal discharge   Nausea and dull discomfort starting in LLQ about 5 days ago. Cramping is extending into lower abdomen, pelvis, and lower back.  No pain medication use. No vomiting.  Today is having diarrhea for the first time in a few days.  States her symptoms are progressive as the day progresses.   Also reporting a burning feeling in the vagina that passes.  No dysuria.  No obvious blood in her urine.   No appetite and this is normal with her IBS symptoms.   Hx IBS. Had a flare up 2 weeks ago with diarrhea.  This became under control. Takes Peptobismol and uses the [employer] Diet.  Gluten and dairy free.  Last colonoscopy in the last year.   Has vaginal light bleeding about every 6 weeks.   Pelvic US 07/08/16 for similar LLQ pain symptoms.  Uterus normal and IUD in endometrial canal.  Ovaries normal.   Last sexual active one week ago.  No pain with intercourse.  Some mucousy vaginal discharge.  No itching.  Maybe now odor.   Feels she is at a crossroads in her life.  In counseling.  Stress with her children.  Going on vacation by herself this week and looking forward to it.  She states she is "safe" today.  Yoga teacher.   Urine: Negative  GYNECOLOGIC HISTORY: Patient's last menstrual period was 12/21/2017 (within days). Contraception:  IUD  Menopausal hormone therapy:  n/a Last mammogram:  05/19/17 BIRADS 1 negative Last pap smear:   03/12/16 Pap and HR HPV negative        OB History    Gravida  2   Para  2   Term  2   Preterm      AB      Living  2     SAB      TAB      Ectopic      Multiple      Live Births                 Patient Active Problem List   Diagnosis Date Noted  . Heart  palpitations 03/26/2015  . Gestational diabetes   . ABDOMINAL PAIN, PERIUMBILICAL 05/09/2008  . ANXIETY 03/24/2008  . ABDOMINAL BLOATING 03/24/2008  . Abdominal pain 03/24/2008    Past Medical History:  Diagnosis Date  . Anemia   . Anxiety    with menses  . Depression    menses related  . Gestational diabetes     Past Surgical History:  Procedure Laterality Date  . INTRAUTERINE DEVICE (IUD) INSERTION     mirena inserted 09-12-13  . IUD REMOVAL     mirena removed 8/14    Current Outpatient Medications  Medication Sig Dispense Refill  . clonazePAM (KLONOPIN) 0.5 MG tablet TAKE ONE-HALF TABLET AS NEEDED  1  . fexofenadine (ALLEGRA) 180 MG tablet Take 180 mg by mouth daily.    Marland Kitchen lamoTRIgine (LAMICTAL) 25 MG tablet Take 25 mg by mouth daily.    . Levonorgestrel (MIRENA IU) by Intrauterine route. Was placed 09/11/14    . Multiple Vitamins-Minerals (MULTIVITAMIN PO) Take by mouth daily.     No current facility-administered medications for this visit.      ALLERGIES: Erythromycin  Family History  Problem Relation Age of Onset  . Diabetes Mother   . Cancer Mother        lung  . Hypertension Mother   . Heart disease Mother   . Depression Mother        personality disorder  . Hypertension Father   . Cancer Father        pancreatic  . Heart disease Father   . Hyperlipidemia Father   . Cancer Maternal Grandmother        colon  . Stroke Maternal Grandmother   . Cancer Cousin        breast    Social History   Socioeconomic History  . Marital status: Divorced    Spouse name: Not on file  . Number of children: Not on file  . Years of education: Not on file  . Highest education level: Not on file  Occupational History  . Not on file  Social Needs  . Financial resource strain: Not on file  . Food insecurity:    Worry: Not on file    Inability: Not on file  . Transportation needs:    Medical: Not on file    Non-medical: Not on file  Tobacco Use  . Smoking  status: Former Smoker    Types: Cigarettes  . Smokeless tobacco: Never Used  Substance and Sexual Activity  . Alcohol use: Yes    Alcohol/week: 3.0 - 4.2 oz    Types: 5 - 7 Standard drinks or equivalent per week  . Drug use: No  . Sexual activity: Yes    Partners: Male    Birth control/protection: IUD  Lifestyle  . Physical activity:    Days per week: Not on file    Minutes per session: Not on file  . Stress: Not on file  Relationships  . Social connections:    Talks on phone: Not on file    Gets together: Not on file    Attends religious service: Not on file    Active member of club or organization: Not on file    Attends meetings of clubs or organizations: Not on file    Relationship status: Not on file  . Intimate partner violence:    Fear of current or ex partner: Not on file    Emotionally abused: Not on file    Physically abused: Not on file    Forced sexual activity: Not on file  Other Topics Concern  . Not on file  Social History Narrative  . Not on file    Review of Systems  Constitutional: Negative.   HENT: Negative.   Eyes: Negative.   Respiratory: Negative.   Cardiovascular: Negative.   Gastrointestinal: Positive for diarrhea and nausea.  Endocrine: Negative.   Genitourinary: Positive for frequency and vaginal discharge.  Musculoskeletal: Negative.   Allergic/Immunologic: Negative.   Neurological: Negative.   Hematological: Negative.   Psychiatric/Behavioral: Negative.     PHYSICAL EXAMINATION:    BP 128/76 (BP Location: Right Arm, Patient Position: Sitting, Cuff Size: Normal)   Pulse 84   Temp 97.6 F (36.4 C) (Oral)   Resp 16   Ht 5\' 1"  (1.549 m)   Wt 143 lb (64.9 kg)   LMP 12/21/2017 (Within Days)   BMI 27.02 kg/m     General appearance: alert, cooperative and appears stated age Head: Normocephalic, without obvious abnormality, atraumatic Lungs: clear to auscultation bilaterally Heart: regular rate and rhythm Abdomen: soft, non-tender,  no masses,  no organomegaly Extremities:  extremities normal, atraumatic, no cyanosis or edema Skin: Skin color, texture, turgor normal. No rashes or lesions No abnormal inguinal nodes palpated Neurologic: Grossly normal  Pelvic: External genitalia:  no lesions              Urethra:  normal appearing urethra with no masses, tenderness or lesions              Bartholins and Skenes: normal                 Vagina: normal appearing vagina with normal color and discharge, no lesions              Cervix: no lesions.  IUD strings seen.                 Bimanual Exam:  Uterus:  normal size, contour, position, consistency, mobility, non-tender              Adnexa: no mass, fullness, tenderness              Rectal exam: Yes.  .  Confirms.              Anus:  normal sphincter tone, no lesions  Chaperone was present for exam.  ASSESSMENT  LLQ pain. No acute abdomen.  No evidence of diverticulitis, appendicitis, or PID.  Mirena IUD patient. IBS.  Vaginitis.  Urinary frequency.  No UTI. Life stress.   PLAN  UPT - negative. Vaginitis and cervicitis testing.  CMP, CBC with diff. She will return if her symptoms persist or increase.  No pelvic US needed at this time.  I recommended follow up with Dr. Loreta AveMann for medical therapy for IBS flares.  I do not recommend regular use of Peptobismol.   An After Visit Summary was printed and given to the patient.  __25____ minutes face to face time of which over 50% was spent in counseling.

## 2018-01-12 LAB — COMPREHENSIVE METABOLIC PANEL
ALT: 13 IU/L (ref 0–32)
AST: 17 IU/L (ref 0–40)
Albumin/Globulin Ratio: 1.8 (ref 1.2–2.2)
Albumin: 4.7 g/dL (ref 3.5–5.5)
Alkaline Phosphatase: 59 IU/L (ref 39–117)
BUN/Creatinine Ratio: 17 (ref 9–23)
BUN: 10 mg/dL (ref 6–24)
Bilirubin Total: 0.4 mg/dL (ref 0.0–1.2)
CO2: 27 mmol/L (ref 20–29)
Calcium: 9.9 mg/dL (ref 8.7–10.2)
Chloride: 98 mmol/L (ref 96–106)
Creatinine, Ser: 0.58 mg/dL (ref 0.57–1.00)
GFR calc Af Amer: 127 mL/min/{1.73_m2} (ref >59)
GFR calc non Af Amer: 110 mL/min/{1.73_m2} (ref >59)
Globulin, Total: 2.6 g/dL (ref 1.5–4.5)
Glucose: 74 mg/dL (ref 65–99)
Potassium: 4.1 mmol/L (ref 3.5–5.2)
Sodium: 138 mmol/L (ref 134–144)
Total Protein: 7.3 g/dL (ref 6.0–8.5)

## 2018-01-12 LAB — CBC WITH DIFFERENTIAL/PLATELET
Basophils Absolute: 0 10*3/uL (ref 0.0–0.2)
Basos: 0 %
EOS (ABSOLUTE): 0 10*3/uL (ref 0.0–0.4)
Eos: 1 %
Hematocrit: 43.7 % (ref 34.0–46.6)
Hemoglobin: 14.8 g/dL (ref 11.1–15.9)
Immature Grans (Abs): 0 10*3/uL (ref 0.0–0.1)
Immature Granulocytes: 0 %
Lymphocytes Absolute: 2.1 10*3/uL (ref 0.7–3.1)
Lymphs: 32 %
MCH: 33 pg (ref 26.6–33.0)
MCHC: 33.9 g/dL (ref 31.5–35.7)
MCV: 97 fL (ref 79–97)
Monocytes Absolute: 0.6 10*3/uL (ref 0.1–0.9)
Monocytes: 9 %
Neutrophils Absolute: 3.9 10*3/uL (ref 1.4–7.0)
Neutrophils: 58 %
Platelets: 290 10*3/uL (ref 150–450)
RBC: 4.49 x10E6/uL (ref 3.77–5.28)
RDW: 12.5 % (ref 12.3–15.4)
WBC: 6.6 10*3/uL (ref 3.4–10.8)

## 2018-01-12 LAB — CERVICOVAGINAL ANCILLARY ONLY
Bacterial vaginitis: NEGATIVE
Candida vaginitis: NEGATIVE
Chlamydia: NEGATIVE
Neisseria Gonorrhea: NEGATIVE
Trichomonas: NEGATIVE

## 2018-04-20 ENCOUNTER — Ambulatory Visit: Payer: 59 | Admitting: Certified Nurse Midwife

## 2018-05-06 ENCOUNTER — Encounter: Payer: Self-pay | Admitting: Certified Nurse Midwife

## 2018-05-06 ENCOUNTER — Other Ambulatory Visit: Payer: Self-pay

## 2018-05-06 ENCOUNTER — Ambulatory Visit: Payer: 59 | Admitting: Certified Nurse Midwife

## 2018-05-06 VITALS — BP 110/70 | HR 70 | Resp 16 | Ht 60.75 in | Wt 143.0 lb

## 2018-05-06 DIAGNOSIS — Z30431 Encounter for routine checking of intrauterine contraceptive device: Secondary | ICD-10-CM | POA: Diagnosis not present

## 2018-05-06 DIAGNOSIS — Z01419 Encounter for gynecological examination (general) (routine) without abnormal findings: Secondary | ICD-10-CM

## 2018-05-06 DIAGNOSIS — Z124 Encounter for screening for malignant neoplasm of cervix: Secondary | ICD-10-CM

## 2018-05-06 NOTE — Progress Notes (Signed)
47 y.o. G40P2002 Divorced  Caucasian Fe here for annual exam. Periods sporadic, with light menses occasional. no issues. Patient has started on prenatal vitamins for hair loss. Had evaluation with PCP and now plans to see dermatology regarding hair changes. Sees PCP yearly with labs, all normal. Some social stress with relationship and this maybe an issue also. Mirena IUD working well. Social smoking of cigarettes only. No partner change or STD concerns. No other health issues today.  Patient's last menstrual period was 05/04/2018 (exact date).          Sexually active: No.  The current method of family planning is IUD, inserted 2/15.    Exercising: Yes.    walk, yoga, lifting Smoker:  no  Review of Systems  Constitutional:       Headache, sinusitis  HENT: Negative.   Eyes: Negative.   Respiratory: Negative.   Cardiovascular: Negative.   Gastrointestinal: Negative.   Genitourinary: Negative.   Musculoskeletal: Negative.   Skin:       Hair loss  Neurological: Negative.   Endo/Heme/Allergies: Negative.   Psychiatric/Behavioral: Negative.     Health Maintenance: Pap:  03-12-16 neg HPV HR neg History of Abnormal Pap: no MMG:  05-19-17 category c density birads 1:neg scheduled mammogram Self Breast exams: occ Colonoscopy:  2018 polyps repeat 5 years BMD:  none TDaP:  2013 Shingles: no Pneumonia: no Hep C and HIV: both neg 2017 Labs: if needed.   reports that she has quit smoking. Her smoking use included cigarettes. She has never used smokeless tobacco. She reports that she drinks about 5.0 - 7.0 standard drinks of alcohol per week. She reports that she does not use drugs.  Past Medical History:  Diagnosis Date  . Anemia   . Anxiety    with menses  . Depression    menses related  . Gestational diabetes     Past Surgical History:  Procedure Laterality Date  . INTRAUTERINE DEVICE (IUD) INSERTION     mirena inserted 09-12-13  . IUD REMOVAL     mirena removed 8/14     Current Outpatient Medications  Medication Sig Dispense Refill  . B Complex Vitamins (B COMPLEX PO) Take by mouth.    . clonazePAM (KLONOPIN) 0.5 MG tablet TAKE ONE-HALF TABLET AS NEEDED  1  . Cyanocobalamin (VITAMIN B 12 PO) Take by mouth.    . fexofenadine (ALLEGRA) 180 MG tablet Take 180 mg by mouth daily.    . Levonorgestrel (MIRENA IU) by Intrauterine route. Was placed 09/11/14    . Multiple Vitamins-Minerals (MULTIVITAMIN PO) Take by mouth daily.    . Prenatal Vit-Fe Fumarate-FA (PRENATAL VITAMIN PO) Take by mouth.     No current facility-administered medications for this visit.     Family History  Problem Relation Age of Onset  . Diabetes Mother   . Cancer Mother        lung  . Hypertension Mother   . Heart disease Mother   . Depression Mother        personality disorder  . Hypertension Father   . Cancer Father        pancreatic  . Heart disease Father   . Hyperlipidemia Father   . Cancer Maternal Grandmother        colon  . Stroke Maternal Grandmother   . Cancer Cousin        breast    ROS:  Pertinent items are noted in HPI.  Otherwise, a comprehensive ROS was negative.  Exam:  BP 110/70   Pulse 70   Resp 16   Ht 5' 0.75" (1.543 m)   Wt 143 lb (64.9 kg)   LMP 05/04/2018 (Exact Date)   BMI 27.24 kg/m  Height: 5' 0.75" (154.3 cm) Ht Readings from Last 3 Encounters:  05/06/18 5' 0.75" (1.543 m)  01/11/18 5\' 1"  (1.549 m)  04/16/17 5' 0.75" (1.543 m)    General appearance: alert, cooperative and appears stated age Head: Normocephalic, without obvious abnormality, atraumatic Neck: no adenopathy, supple, symmetrical, trachea midline and thyroid normal to inspection and palpation Lungs: clear to auscultation bilaterally Breasts: normal appearance, no masses or tenderness, No nipple retraction or dimpling, No nipple discharge or bleeding, No axillary or supraclavicular adenopathy Heart: regular rate and rhythm Abdomen: soft, non-tender; no masses,  no  organomegaly Extremities: extremities normal, atraumatic, no cyanosis or edema Skin: Skin color, texture, turgor normal. No rashes or lesions Lymph nodes: Cervical, supraclavicular, and axillary nodes normal. No abnormal inguinal nodes palpated Neurologic: Grossly normal   Pelvic: External genitalia:  no lesions              Urethra:  normal appearing urethra with no masses, tenderness or lesions              Bartholin's and Skene's: normal                 Vagina: normal appearing vagina with normal color and discharge, no lesions              Cervix: multiparous appearance, no cervical motion tenderness, no lesions and IUD strings noted in cervix              Pap taken: Yes.   Bimanual Exam:  Uterus:  normal size, contour, position, consistency, mobility, non-tender and anteverted              Adnexa: normal adnexa and no mass, fullness, tenderness               Rectovaginal: Confirms               Anus:  normal sphincter tone, no lesions  Chaperone present: yes  A:  Well Woman with normal exam  Contraception Mirena IUD due for replacement 08/2018  Hair loss with PCP evaluation, but planning to see dermatology  Smoker occasional  P:   Reviewed health and wellness pertinent to exam  Aware of need to remove and replace in 2020 if desires. Encouraged to call in 07/28/2018 to schedule with period.  Discussed she may want to change hair products to see if this helps also.  Continue follow up with MD as needed  Pap smear: yes   counseled on breast self exam, mammography screening, STD prevention, HIV risk factors and prevention, adequate intake of calcium and vitamin D, diet and exercise  return annually or prn  An After Visit Summary was printed and given to the patient.

## 2018-05-06 NOTE — Patient Instructions (Signed)

## 2018-05-07 ENCOUNTER — Other Ambulatory Visit (HOSPITAL_COMMUNITY)
Admission: RE | Admit: 2018-05-07 | Discharge: 2018-05-07 | Disposition: A | Discharge status: Home / Self Care | Payer: 59 | Source: Ambulatory Visit | Attending: Certified Nurse Midwife | Admitting: Certified Nurse Midwife

## 2018-05-07 DIAGNOSIS — Z124 Encounter for screening for malignant neoplasm of cervix: Secondary | ICD-10-CM | POA: Diagnosis present

## 2018-05-07 NOTE — Addendum Note (Signed)
Addended by: Verner Chol on: 05/07/2018 08:20 AM   Modules accepted: Orders

## 2018-05-11 LAB — CYTOLOGY - PAP
Diagnosis: NEGATIVE
HPV: NOT DETECTED

## 2018-09-10 ENCOUNTER — Telehealth: Payer: Self-pay | Admitting: Certified Nurse Midwife

## 2018-09-10 DIAGNOSIS — Z30433 Encounter for removal and reinsertion of intrauterine contraceptive device: Secondary | ICD-10-CM

## 2018-09-10 NOTE — Telephone Encounter (Signed)
yes

## 2018-09-10 NOTE — Telephone Encounter (Signed)
Spoke with patient. Requesting to schedule Mirena IUD exchange. Mirena IUD placed 09/12/13. Patient states she is not currently SA, does not have regular menses with IUD, states they are sporadic and light.   Mirena IUD exchange scheduled for 2/25 at 2pm with Leota Sauers, CNM. Advised to continue to abstain until IUD exchange completed, may need UPT day of procedure. Will review with Leota Sauers, CNM and return call if any additonal recommendations. Patient verbalizes understanding and is agreeable.   Last AEX 05/06/18 Orders placed for precert.   Leota Sauers, CNM-please review, ok to proceed as scheduled?   Cc: Harland Dingwall

## 2018-09-10 NOTE — Telephone Encounter (Signed)
Patient would like to schedule IUD exchange.

## 2018-09-13 NOTE — Telephone Encounter (Signed)
Encounter closed

## 2018-09-14 ENCOUNTER — Telehealth: Payer: Self-pay | Admitting: Certified Nurse Midwife

## 2018-09-14 NOTE — Telephone Encounter (Signed)
Call placed to convey benefits. 

## 2018-09-21 ENCOUNTER — Ambulatory Visit: Payer: Self-pay | Admitting: Certified Nurse Midwife

## 2018-09-21 NOTE — Telephone Encounter (Signed)
Left message to call Noreene Larsson, RN at [facility] [phone].     Mirena IUD exchange. Mirena IUD placed 09/12/13.

## 2018-09-21 NOTE — Telephone Encounter (Signed)
Patient canceled her Mirena IUD insertion today due to a death in her family. She would like to reschedule.

## 2018-09-30 NOTE — Telephone Encounter (Signed)
Left detailed message, ok per dpr, name identified on voicemail. Calling to assist with rescheduing Mirena IUD exchange, return call to call Noreene Larsson, [place] at [facility] [phone].

## 2018-10-07 NOTE — Telephone Encounter (Signed)
MyChart active, message to patient.

## 2018-10-21 NOTE — Telephone Encounter (Signed)
Routing to [employer] CNM. Unable to reach patient to reschedule appointment. Encounter closed.

## 2019-01-12 ENCOUNTER — Telehealth: Payer: Self-pay | Admitting: Certified Nurse Midwife

## 2019-01-12 NOTE — Telephone Encounter (Signed)
Patient is calling to reschedule IUD exchange.

## 2019-01-12 NOTE — Telephone Encounter (Signed)
Spoke with patient, calling to reschedule Mirena IUD exchange. Mirena placed 08/2013. Patient is not currently SA. IUD exchange scheduled for 01/24/19 at 4pm with Melvia Heaps, CNM. Advised to continue to abstain until IUD exchange, will check UPT day of exchange. Advised to take Motrin 800 mg with food and water one hour before procedure. Patient has new [employer], info forwarded to business office to precert. [id number] precautions reviewed.   Routing to provider for final review. Patient is agreeable to disposition. Will close encounter.  Cc: Lerry Liner

## 2019-01-14 ENCOUNTER — Telehealth: Payer: Self-pay | Admitting: Certified Nurse Midwife

## 2019-01-14 NOTE — Telephone Encounter (Signed)
Spoke with patient and she gave me the phone number for her new insurance. [employer] is not open today I will call then on Tuesday to verify her benefits. Patient is aware.

## 2019-01-18 NOTE — Telephone Encounter (Signed)
Call placed to convey new insurance benefits.

## 2019-01-24 ENCOUNTER — Encounter: Payer: Self-pay | Admitting: Certified Nurse Midwife

## 2019-01-24 ENCOUNTER — Other Ambulatory Visit: Payer: Self-pay

## 2019-01-24 ENCOUNTER — Ambulatory Visit (INDEPENDENT_AMBULATORY_CARE_PROVIDER_SITE_OTHER): Payer: BC Managed Care – PPO | Admitting: Certified Nurse Midwife

## 2019-01-24 VITALS — BP 114/70 | HR 70 | Temp 97.4°F | Resp 16 | Wt 153.0 lb

## 2019-01-24 DIAGNOSIS — Z01812 Encounter for preprocedural laboratory examination: Secondary | ICD-10-CM | POA: Diagnosis not present

## 2019-01-24 DIAGNOSIS — Z30433 Encounter for removal and reinsertion of intrauterine contraceptive device: Secondary | ICD-10-CM

## 2019-01-24 LAB — POCT URINE PREGNANCY: Preg Test, Ur: NEGATIVE

## 2019-01-24 NOTE — Patient Instructions (Signed)

## 2019-01-24 NOTE — Progress Notes (Addendum)
Review of Systems  Constitutional: Negative.   HENT: Negative.        Allergies  Eyes: Negative.   Respiratory: Negative.   Cardiovascular: Negative.   Gastrointestinal:       IBS no change  Genitourinary: Negative.   Musculoskeletal: Negative.   Skin: Negative.   Neurological: Negative.   Endo/Heme/Allergies: Negative.   Psychiatric/Behavioral:       No change with anxiety    48 yrs Caucasian Divorced G2P2002 LMP spotting today.UPT negative.  Presents for Mirena removal and reinsertion of new Mirena. Denies any vaginal symptoms or STD concerns.  Plans for contraception are replacement with Mirena. Patient took 400 mg of Advil prior to arrival. Has had replacement before and has been very happy with Mirena. No health issues today. Patient read and signed consent for IUD removal and Mirena IUD insertion. No questions.  LMP : 01/24/2019          HPI neg.  Exam: Healthy female WDWN Orientation X 3  Affect normal Abdomen: soft non-tender Groin:non tender, no enlargement,  Pelvic exam:: VULVA: Normal apperance Vagina: normal appearance Cervix: Normal parous appearance with IUD string noted in cervical os Uterus: Anteverted non tender Adnexa; Normal, non tender Procedure: Speculum placed, cervix visualized.  IUD string visualized, grasp with ring forceps, with gentle traction IUD removed intact.  IUD shown to patient and discarded. Cervix cleansed with Betadine x 3. Tenaculum placed at 10 and 2. Mirena IUD Lot [id number], Expiration Date 12/2020 removed from sterile package with sterile clothes. IUD was checked to make sure working for insertion. Uterus sounded to 7 cm, Mirena IUD inserted without difficulty and released into uterine cavity. Inserted removed. IUD strings trimmed to 3 cm. Tenaculum removed with no bleeding noted. Speculum removed, uterus palpated normal. Patient tolerated procedure well. Instructions printed and verbal and ID card for removal in 01/24/2024 given to patient.  Patient ambulated without assistance and escorted to check out in stable condition with no complaints.  Assessment:Mirena removal and Mirena IUD  Lot [id number], Exp.6/22 inserted. Pt tolerated procedure well.  Plan: Patient to return after next period on one month to recheck IUD and shorten string if needed.Questions addressed  RV one month, prn

## 2019-01-25 ENCOUNTER — Telehealth: Payer: Self-pay | Admitting: Certified Nurse Midwife

## 2019-01-25 NOTE — Telephone Encounter (Signed)
Left patient a message to call and schedule her 4 week IUD check.

## 2019-02-23 ENCOUNTER — Other Ambulatory Visit: Payer: Self-pay

## 2019-02-23 ENCOUNTER — Ambulatory Visit: Payer: BC Managed Care – PPO | Admitting: Certified Nurse Midwife

## 2019-02-23 ENCOUNTER — Encounter: Payer: Self-pay | Admitting: Certified Nurse Midwife

## 2019-02-23 VITALS — BP 120/74 | HR 68 | Temp 97.3°F | Resp 16 | Wt 153.0 lb

## 2019-02-23 DIAGNOSIS — Z01419 Encounter for gynecological examination (general) (routine) without abnormal findings: Secondary | ICD-10-CM

## 2019-02-23 DIAGNOSIS — Z30431 Encounter for routine checking of intrauterine contraceptive device: Secondary | ICD-10-CM

## 2019-02-23 NOTE — Progress Notes (Signed)
Review of Systems  Constitutional: Negative.   HENT: Negative.   Eyes: Negative.   Respiratory: Negative.   Cardiovascular: Negative.   Genitourinary: Negative.   Musculoskeletal: Negative.   Skin: Negative.   Neurological: Negative.   Endo/Heme/Allergies: Negative.   Psychiatric/Behavioral: Negative.       48 y.o. Divorced Caucasian G2P2002here for evaluation of Mirena IUD  initiated on June  29/2020. Denies any cramping or pain since insertion. Had menses after insertion. Menses duration 5 days with moderate      flow.  Denies  headaches, nausea, DVT warning signs or symptoms,  breakthrough bleeding, or other changes, since insertion..  No other health issues today.Happy with choice.  Physical Exam Exam conducted with a chaperone present.  Constitutional:      Appearance: Normal appearance.  Cardiovascular:     Rate and Rhythm: Normal rate.  Pulmonary:     Effort: Pulmonary effort is normal.  Abdominal:     Palpations: Abdomen is soft.  Genitourinary:    Exam position: Lithotomy position.     Labia:        Right: No rash, tenderness or lesion.        Left: No rash, tenderness or lesion.      Vagina: Normal.     Cervix: Normal.     Uterus: Normal.      Adnexa: Right adnexa normal and left adnexa normal.     Rectum: Normal.     Comments: IUD string noted in cervix appropriate length. Lymphadenopathy:     Lower Body: No right inguinal adenopathy. No left inguinal adenopathy.  Skin:    General: Skin is warm and dry.  Neurological:     Mental Status: She is alert and oriented to person, place, and time.  Psychiatric:        Mood and Affect: Mood normal.        Behavior: Behavior normal.        Thought Content: Thought content normal.        Judgment: Judgment normal.      O: Healthy female, WD WN Affect: normal orientation X 3    A: History of Mirena IUD working well Normal pelvic exam  P: Aware of warning signs with use and period profile  expectations Questions addressed.  Rv prn, aex

## 2019-05-17 ENCOUNTER — Encounter: Payer: Self-pay | Admitting: Certified Nurse Midwife

## 2019-05-17 ENCOUNTER — Other Ambulatory Visit: Payer: Self-pay

## 2019-05-17 ENCOUNTER — Ambulatory Visit: Payer: BC Managed Care – PPO | Admitting: Certified Nurse Midwife

## 2019-05-17 VITALS — BP 116/70 | HR 64 | Temp 97.1°F | Resp 16 | Ht 61.0 in | Wt 153.0 lb

## 2019-05-17 DIAGNOSIS — Z23 Encounter for immunization: Secondary | ICD-10-CM

## 2019-05-17 DIAGNOSIS — Z01419 Encounter for gynecological examination (general) (routine) without abnormal findings: Secondary | ICD-10-CM | POA: Diagnosis not present

## 2019-05-17 DIAGNOSIS — Z8659 Personal history of other mental and behavioral disorders: Secondary | ICD-10-CM | POA: Diagnosis not present

## 2019-05-17 DIAGNOSIS — Z30431 Encounter for routine checking of intrauterine contraceptive device: Secondary | ICD-10-CM

## 2019-05-17 NOTE — Progress Notes (Signed)
48 y.o. [id number] Divorce  Caucasian Fe here for annual exam. Scant to no bleeding with Mirena IUD. Denies any warning signs, happy with choice. Busy with being at home and helping 48 year old. No health issues today. Sees Nadyne Coombes for yearly aex and labs. Would like to have Flu vaccine.  No LMP recorded. (Menstrual status: IUD).          Sexually active: No.  The current method of family planning is IUD.    Exercising: Yes.    walking & yoga Smoker:  no  Review of Systems  Constitutional: Negative.   HENT: Negative.   Eyes: Negative.   Respiratory: Negative.   Cardiovascular: Negative.   Gastrointestinal: Negative.   Genitourinary: Negative.   Musculoskeletal: Negative.   Skin: Negative.   Neurological: Negative.   Endo/Heme/Allergies: Negative.   Psychiatric/Behavioral: Negative.     Health Maintenance: Pap:  03-12-16 neg HPV HR neg, 05-07-18 neg HPV HR neg History of Abnormal Pap: no MMG:  05-20-18 category c density birads 2:neg Self Breast exams: occ Colonoscopy:  2018 polyps f/u 61yrs BMD:   none TDaP:  2013 Shingles: no Pneumonia: not done Hep C and HIV: both neg 2017 Labs: PCP   reports that she has quit smoking. Her smoking use included cigarettes. She has never used smokeless tobacco. She reports current alcohol use of about 5.0 - 7.0 standard drinks of alcohol per week. She reports that she does not use drugs.  Past Medical History:  Diagnosis Date  . Anemia   . Anxiety    with menses  . Depression    menses related  . Gestational diabetes     Past Surgical History:  Procedure Laterality Date  . INTRAUTERINE DEVICE (IUD) INSERTION     mirena inserted 09-12-13, removed & re-inserted 01-24-2019  . IUD REMOVAL     mirena removed 8/14    Current Outpatient Medications  Medication Sig Dispense Refill  . B Complex Vitamins (B COMPLEX PO) Take by mouth.    . clonazePAM (KLONOPIN) 0.5 MG tablet TAKE ONE-HALF TABLET AS NEEDED  1  . Cyanocobalamin (VITAMIN  B 12 PO) Take by mouth.    . fexofenadine (ALLEGRA) 180 MG tablet Take 180 mg by mouth daily.    . Levonorgestrel (MIRENA IU) by Intrauterine route. Was placed 09/11/14     No current facility-administered medications for this visit.     Family History  Problem Relation Age of Onset  . Diabetes Mother   . Cancer Mother        lung  . Hypertension Mother   . Heart disease Mother   . Depression Mother        personality disorder  . Hypertension Father   . Cancer Father        pancreatic  . Heart disease Father   . Hyperlipidemia Father   . Cancer Maternal Grandmother        colon  . Stroke Maternal Grandmother   . Cancer Cousin        breast    ROS:  Pertinent items are noted in HPI.  Otherwise, a comprehensive ROS was negative.  Exam:   There were no vitals taken for this visit.   Ht Readings from Last 3 Encounters:  05/06/18 5' 0.75" (1.543 m)  01/11/18 5\' 1"  (1.549 m)  04/16/17 5' 0.75" (1.543 m)    General appearance: alert, cooperative and appears stated age Head: Normocephalic, without obvious abnormality, atraumatic Neck: no adenopathy, supple, symmetrical, trachea  midline and thyroid normal to inspection and palpation Lungs: clear to auscultation bilaterally Breasts: normal appearance, no masses or tenderness, No nipple retraction or dimpling, No nipple discharge or bleeding, No axillary or supraclavicular adenopathy Heart: regular rate and rhythm Abdomen: soft, non-tender; no masses,  no organomegaly Extremities: extremities normal, atraumatic, no cyanosis or edema Skin: Skin color, texture, turgor normal. No rashes or lesions Lymph nodes: Cervical, supraclavicular, and axillary nodes normal. No abnormal inguinal nodes palpated Neurologic: Grossly normal   Pelvic: External genitalia:  no lesions,normal appearance              Urethra:  normal appearing urethra with no masses, tenderness or lesions              Bartholin's and Skene's: normal                  Vagina: normal appearing vagina with normal color and discharge, no lesions              Cervix: multiparous appearance, no cervical motion tenderness and no lesions, IUD string noted in cervix              Pap taken: No. Bimanual Exam:  Uterus:  normal size, contour, position, consistency, mobility, non-tender              Adnexa: normal adnexa and no mass, fullness, tenderness               Rectovaginal: Confirms               Anus:  normal sphincter tone, no lesions  Chaperone present: yes  A:  Well Woman with normal exam  Contraception Mirena IUD  Anxiety with PCP management, all stable  Flu vaccine requested    P:   Reviewed health and wellness pertinent to exam  Reviewed warning signs with IUD and bleeding expectations  Continue follow up with PCP as indicated.  Discussed risks/benefits of Flu vaccine.  Pap smear:no   counseled on breast self exam, mammography screening, STD prevention, feminine hygiene, menopause, adequate intake of calcium and vitamin D, diet and exercise  return annually or prn  An After Visit Summary was printed and given to the patient.

## 2019-05-17 NOTE — Patient Instructions (Signed)

## 2019-05-26 ENCOUNTER — Encounter: Payer: Self-pay | Admitting: Certified Nurse Midwife

## 2019-08-05 ENCOUNTER — Other Ambulatory Visit: Payer: Self-pay

## 2019-10-17 ENCOUNTER — Encounter: Payer: Self-pay | Admitting: Certified Nurse Midwife

## 2019-11-03 ENCOUNTER — Ambulatory Visit: Payer: Self-pay | Attending: Internal Medicine

## 2019-11-03 DIAGNOSIS — Z23 Encounter for immunization: Secondary | ICD-10-CM

## 2019-11-03 NOTE — Progress Notes (Signed)
   Covid-19 Vaccination Clinic  Name:  CAIRA POCHE    MRN: [id number] DOB: [birth year]  11/03/2019  Ms. Fallaw was observed post Covid-19 immunization for 15 minutes without incident. She was provided with Vaccine Information Sheet and instruction to access the V-Safe system.   Ms. Slingerland was instructed to call 911 with any severe reactions post vaccine: Marland Kitchen Difficulty breathing  . Swelling of face and throat  . A fast heartbeat  . A bad rash all over body  . Dizziness and weakness   Immunizations Administered    Name Date Dose VIS Date Route   Pfizer COVID-19 Vaccine 11/03/2019  9:30 AM 0.3 mL 07/08/2019 Intramuscular   Manufacturer: [employer], [employer]   Lot: [id number]   NDC: [address]-5

## 2019-11-28 ENCOUNTER — Ambulatory Visit: Payer: Self-pay | Attending: Internal Medicine

## 2019-11-28 DIAGNOSIS — Z23 Encounter for immunization: Secondary | ICD-10-CM

## 2019-11-28 NOTE — Progress Notes (Signed)
   Covid-19 Vaccination Clinic  Name:  Debbie Cline    MRN: [id number] DOB: 1970-08-13  11/28/2019  [name] was observed post Covid-19 immunization for 15 minutes without incident. She was provided with Vaccine Information Sheet and instruction to access the V-Safe system.   [name] was instructed to call 911 with any severe reactions post vaccine: Marland Kitchen Difficulty breathing  . Swelling of face and throat  . A fast heartbeat  . A bad rash all over body  . Dizziness and weakness   Immunizations Administered    Name Date Dose VIS Date Route   Pfizer COVID-19 Vaccine 11/28/2019  8:45 AM 0.3 mL 09/21/2018 Intramuscular   Manufacturer: [employer], [employer]   Lot: [id number]   NDC: [address]-7

## 2020-05-07 ENCOUNTER — Encounter: Payer: Self-pay | Admitting: Dermatology

## 2020-05-07 ENCOUNTER — Ambulatory Visit: Payer: BC Managed Care – PPO | Admitting: Dermatology

## 2020-05-07 ENCOUNTER — Other Ambulatory Visit: Payer: Self-pay

## 2020-05-07 DIAGNOSIS — L658 Other specified nonscarring hair loss: Secondary | ICD-10-CM | POA: Diagnosis not present

## 2020-05-07 DIAGNOSIS — L918 Other hypertrophic disorders of the skin: Secondary | ICD-10-CM | POA: Diagnosis not present

## 2020-05-07 NOTE — Progress Notes (Deleted)
Noted. Patient wanted Rx for minoxidil for hair loss

## 2020-05-07 NOTE — Patient Instructions (Addendum)
Several dermatological issues today discussed with Berta Denson date of birth 1971/02/05.  The focus was on hair thinning in the front and the top of the scalp which runs in her family.  Clinically this best fits female pattern hair loss and the initial therapy will be 5% Rogaine or generic Rogaine foam.  This is applied daily for 3 to 6 months.  We discussed alternatives including dutasteride and finasteride and spironolactone if the topical minoxidil fails.  Second issue was her tendency to grow skin tags on eyelids and neck; these are all relatively small and not noticeable to other people but quite bothersome to Miller so we will schedule enough time to treat these in the future.  She was a little bit concerned with some fine lines around her mouth and I briefly discussed options but told her that this was not part of my practice.  Finally we discussed general skin care regimen; I basically feel that the ingredients in most products whether affordable or expensive are  virtually same, so she can decide whether she wants to listen to advertisements and choose her products depending on personal preference.

## 2020-05-08 ENCOUNTER — Encounter: Payer: Self-pay | Admitting: Dermatology

## 2020-05-08 NOTE — Progress Notes (Signed)
   Follow-Up Visit   Subjective  Debbie Cline is a 49 y.o. female who presents for the following: Skin Problem (patient has a list refused gown).  Hair loss Location: Scalp Duration:  Quality:  Associated Signs/Symptoms: Modifying Factors:  Severity:  Timing: Context:   Objective  Well appearing patient in no apparent distress; mood and affect are within normal limits.  A focused examination was performed including Scalp, face, neck, back.. Relevant physical exam findings are noted in the Assessment and Plan.   Assessment & Plan    Cutaneous skin tags (3) Head - Anterior (Face); Left Eye; Neck - Anterior  Patient may choose to schedule removal.  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  Several dermatological issues today discussed with Debbie Cline date of birth 05/15/1971.  The focus was on hair thinning in the front and the top of the scalp which runs in her family.  Clinically this best fits female pattern hair loss and the initial therapy will be 5% Rogaine or generic Rogaine foam.  This is applied daily for 3 to 6 months.  We discussed alternatives including dutasteride and finasteride and spironolactone if the topical minoxidil fails.  Second issue was her tendency to grow skin tags on eyelids and neck; these are all relatively small and not noticeable to other people but quite bothersome to Debbie Cline so we will schedule enough time to treat these in the future.  She was a little bit concerned with some fine lines around her mouth and I briefly discussed options but told her that this was not part of my practice.  Finally we discussed general skin care regimen; I basically feel that the ingredients in most products whether affordable or expensive are  virtually same, so she can decide whether she wants to listen to advertisements and choose her products depending on personal  preference.  I, Janalyn Harder, MD, have reviewed all documentation for this visit.  The documentation on 05/08/20 for the exam, diagnosis, procedures, and orders are all accurate and complete.

## 2020-05-18 ENCOUNTER — Ambulatory Visit: Payer: BC Managed Care – PPO | Admitting: Certified Nurse Midwife

## 2020-05-31 ENCOUNTER — Encounter: Payer: Self-pay | Admitting: Obstetrics and Gynecology

## 2020-07-12 ENCOUNTER — Encounter: Payer: BC Managed Care – PPO | Admitting: Dermatology

## 2020-10-30 NOTE — Progress Notes (Signed)
50 y.o. G84P2002 Divorced White or Caucasian Not Hispanic or Latino female here for annual exam. Had spotting when she wiped about 5 days ago.  She has a mirena IUD, inserted in 6/20. No cycles, just occasional spotting. She noticed a small lump on her labia majora in the past few weeks. Not tender.  Not sexually active. Lives with her long term boyfriend, having some issues.   No bowel or bladder c/o. Has IBS, but well controlled.     No LMP recorded. (Menstrual status: IUD).          Sexually active: No.  The current method of family planning is IUD.    Exercising: Yes.    walking and Yoga Smoker:  no  Health Maintenance: Pap:  03-12-16 neg HPV HR neg, 05-07-18 neg HPV HR neg History of abnormal Pap:  no MMG:  05/31/20 Bi-rads 1 neg BMD:   None  Colonoscopy: 2018 TDaP:  2013 Gardasil: NA   reports that she has quit smoking. Her smoking use included cigarettes. She has never used smokeless tobacco. She reports current alcohol use of about 5.0 - 7.0 standard drinks of alcohol per week. She reports that she does not use drugs. Kids are 40 (transgender female) and 24 (female), youngest goes to college in the fall [employer]). She is a Immunologist, also works for a company as an Designer, industrial/product.   Past Medical History:  Diagnosis Date  . Anemia   . Anxiety    with menses  . Depression    menses related  . Gestational diabetes     Past Surgical History:  Procedure Laterality Date  . INTRAUTERINE DEVICE (IUD) INSERTION     mirena inserted 09-12-13, removed & re-inserted 01-24-2019  . IUD REMOVAL     mirena removed 8/14    Current Outpatient Medications  Medication Sig Dispense Refill  . B Complex Vitamins (B COMPLEX PO) Take by mouth.    Marland Kitchen BIOTIN PO Take by mouth.    . clonazePAM (KLONOPIN) 0.5 MG tablet TAKE ONE-HALF TABLET AS NEEDED  1  . Cyanocobalamin (VITAMIN B 12 PO) Take by mouth.    . fexofenadine (ALLEGRA) 180 MG tablet Take 180 mg by mouth  daily.    Marland Kitchen lamoTRIgine (LAMICTAL) 200 MG tablet lamotrigine 200 mg tablet    . Levonorgestrel (MIRENA IU) by Intrauterine route. Was placed 09/11/14    . methylPREDNISolone (MEDROL DOSEPAK) 4 MG TBPK tablet Take by mouth.     No current facility-administered medications for this visit.    Family History  Problem Relation Age of Onset  . Diabetes Mother   . Cancer Mother        lung  . Hypertension Mother   . Heart disease Mother   . Depression Mother        personality disorder  . Hypertension Father   . Cancer Father        pancreatic  . Heart disease Father   . Hyperlipidemia Father   . Cancer Maternal Grandmother        colon  . Stroke Maternal Grandmother   . Cancer Cousin        breast    Review of Systems  All other systems reviewed and are negative.   Exam:   There were no vitals taken for this visit.  Weight change: @WEIGHTCHANGE @ Height:      Ht Readings from Last 3 Encounters:  05/17/19 5\' 1"  (1.549 m)  05/06/18 5' 0.75" (  1.543 m)  01/11/18 5\' 1"  (1.549 m)    General appearance: alert, cooperative and appears stated age Head: Normocephalic, without obvious abnormality, atraumatic Neck: no adenopathy, supple, symmetrical, trachea midline and thyroid normal to inspection and palpation Lungs: clear to auscultation bilaterally Cardiovascular: regular rate and rhythm Breasts: normal appearance, no masses or tenderness Abdomen: soft, non-tender; non distended,  no masses,  no organomegaly Extremities: extremities normal, atraumatic, no cyanosis or edema Skin: Skin color, texture, turgor normal. No rashes or lesions Lymph nodes: Cervical, supraclavicular, and axillary nodes normal. No abnormal inguinal nodes palpated Neurologic: Grossly normal   Pelvic: External genitalia:  no lesions              Urethra:  normal appearing urethra with no masses, tenderness or lesions              Bartholins and Skenes: normal                 Vagina: normal appearing  vagina with normal color and discharge, no lesions              Cervix: no lesions and IUD string 3 cm               Bimanual Exam:  Uterus:  normal size, contour, position, consistency, mobility, non-tender              Adnexa: no mass, fullness, tenderness               Rectovaginal: Confirms               Anus:  normal sphincter tone, no lesions  chaperoned for the exam.  1. Well woman exam Discussed breast self exam Discussed calcium and vit D intake Mammogram and colonoscopy are UTD Labs with primary  2. IUD check up Doing well

## 2020-10-31 ENCOUNTER — Other Ambulatory Visit: Payer: Self-pay

## 2020-10-31 ENCOUNTER — Ambulatory Visit (INDEPENDENT_AMBULATORY_CARE_PROVIDER_SITE_OTHER): Payer: BC Managed Care – PPO | Admitting: Obstetrics and Gynecology

## 2020-10-31 ENCOUNTER — Encounter: Payer: Self-pay | Admitting: Obstetrics and Gynecology

## 2020-10-31 VITALS — BP 110/70 | HR 64 | Ht 60.5 in | Wt 157.0 lb

## 2020-10-31 DIAGNOSIS — Z01419 Encounter for gynecological examination (general) (routine) without abnormal findings: Secondary | ICD-10-CM

## 2020-10-31 DIAGNOSIS — Z30431 Encounter for routine checking of intrauterine contraceptive device: Secondary | ICD-10-CM | POA: Diagnosis not present

## 2020-10-31 NOTE — Patient Instructions (Signed)

## 2020-11-05 ENCOUNTER — Ambulatory Visit: Payer: BC Managed Care – PPO | Admitting: Dermatology

## 2021-01-02 ENCOUNTER — Ambulatory Visit: Payer: BC Managed Care – PPO | Admitting: Dermatology

## 2022-02-14 ENCOUNTER — Encounter: Payer: Self-pay | Admitting: Obstetrics and Gynecology

## 2022-03-05 ENCOUNTER — Telehealth: Payer: Self-pay

## 2022-03-05 NOTE — Telephone Encounter (Signed)
Patient had screening mammo at [facility] on 02/13/22. She is in mammo hold. No result was on file for diagnostic follow up so I called [facility] to check on appt. The lady I spoke with said her order for diagnostic right breast mammo and u/s had been cancelled. She said it is cancelled when they try 3 times to reach patient and they dont call back.  I called patient and left message on voice mail asking her to call me about a recommended follow up visit.

## 2022-03-25 NOTE — Telephone Encounter (Signed)
Patient's voice mail message said she will not have access to vm until 03/28/22. I left message that I was calling to follow up on diagnostic imaging that was recommended following her screening mammogram. I asked her to call me so I can assist her with getting that scheduled.

## 2022-03-27 NOTE — Telephone Encounter (Signed)
I had been trying to reach patient because with her screening mammogram she was recommended to have diagnostic imaging and she was still in mammo hold and not scheduled.  She called me today and left message that she has moved to [facility] and she has had all her records transferred and has follow up diagnostic imaging scheduled there in [place], [place].  Ok to remove from mammo hold?

## 2022-04-01 NOTE — Telephone Encounter (Signed)
Yes, thank you.

## 2022-04-03 NOTE — Telephone Encounter (Signed)
Removed from mammo hold.

## 2024-02-09 NOTE — Therapy (Incomplete)
 OUTPATIENT PHYSICAL THERAPY  LOWER EXTREMITY ONCOLOGY EVALUATION  Patient Name: JENYFER TRAWICK MRN: [id number] DOB:16-Oct-1970, 53 y.o., female Today's Date: 02/09/2024  END OF SESSION:   Past Medical History:  Diagnosis Date   Anemia    Anxiety    with menses   Depression    menses related   Gestational diabetes    Past Surgical History:  Procedure Laterality Date   INTRAUTERINE DEVICE (IUD) INSERTION     mirena  inserted 09-12-13, removed & re-inserted 01-24-2019   IUD REMOVAL     mirena  removed 8/14   Patient Active Problem List   Diagnosis Date Noted   Heart palpitations 03/26/2015   Gestational diabetes    Periumbilical abdominal pain 05/09/2008   Anxiety state 03/24/2008   ABDOMINAL BLOATING 03/24/2008   Abdominal pain 03/24/2008    PCP: Darice Henle, MD  REFERRING PROVIDER: Albesa Duster, PA-C  REFERRING DIAG:  Diagnosis  C25.9 (ICD-10-CM) - Malignant neoplasm of pancreas, unspecified  C78.00 (ICD-10-CM) - Secondary malignant neoplasm of unspecified lung    THERAPY DIAG:  No diagnosis found.  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  Treatment on clinical trial and chemo [id number] and folfirinox  PAIN:  Are you having pain? {yes/no:20286} NPRS scale: ***/10 Pain location: *** Pain orientation: {Pain Orientation:25161}  PAIN TYPE: {type:313116} Pain description: {PAIN DESCRIPTION:21022940}  Aggravating factors: *** Relieving factors: ***  PRECAUTIONS: {Therapy precautions:24002}  RED FLAGS: {PT Red Flags:29287}   WEIGHT BEARING RESTRICTIONS: {Yes ***/No:24003}  FALLS:  Has patient fallen in last 6  months? {fallsyesno:27318}  LIVING ENVIRONMENT: Lives with: {[facility] lives with:25569::lives with their family} Lives in: {Lives in:25570} Stairs: {yes/no:20286}; {Stairs:24000} Has following equipment at home: {Assistive devices:23999}  OCCUPATION: ***  LEISURE: ***  PRIOR LEVEL OF FUNCTION: {PLOF:24004}  PATIENT GOALS: ***   OBJECTIVE: Note: Objective measures were completed at Evaluation unless otherwise noted.  COGNITION: Overall cognitive status: {cognition:24006}   PALPATION: ***  OBSERVATIONS / OTHER ASSESSMENTS: ***  SENSATION: Light touch: {intact/deficits:24005} Stereognosis: {intact/deficits:24005} Hot/Cold: {intact/deficits:24005} Proprioception: {intact/deficits:24005}  POSTURE: ***  LOWER EXTREMITY STRENGTH:  MMT Right eval  Hip flexion   Hip extension   Hip abduction   Hip adduction   Hip internal rotation   Hip external rotation   Knee flexion   Knee extension   Ankle dorsiflexion  Ankle plantarflexion   Ankle inversion   Ankle eversion   Great toe extension    (Blank rows = not tested)  MMT LEFT eval  Hip flexion   Hip extension   Hip abduction   Hip adduction   Hip internal rotation   Hip external rotation   Knee flexion   Knee extension   Ankle dorsiflexion   Ankle plantarflexion   Ankle inversion   Ankle eversion   Great toe extension     (Blank rows = not tested)  LYMPHEDEMA ASSESSMENTS:   SURGERY TYPE/DATE: ***  NUMBER OF LYMPH NODES REMOVED: ***  CHEMOTHERAPY: ***  RADIATION:***  HORMONE TREATMENT: ***  INFECTIONS: ***  LYMPHEDEMA ASSESSMENTS:   LOWER EXTREMITY LANDMARK RIGHT eval  At groin   30 cm proximal to suprapatella   20 cm proximal to suprapatella   10 cm proximal to suprapatella   At midpatella / popliteal crease   30 cm proximal to floor at lateral plantar foot   20 cm proximal to floor at lateral plantar foot   10 cm proximal to floor at lateral plantar foot   Circumference of ankle/heel    5 cm proximal to 1st MTP joint   Across MTP joint   Around proximal great toe   (Blank rows = not tested)  LOWER EXTREMITY LANDMARK LEFT eval  At groin   30 cm proximal to suprapatella   20 cm proximal to suprapatella   10 cm proximal to suprapatella   At midpatella / popliteal crease   30 cm proximal to floor at lateral plantar foot   20 cm proximal to floor at lateral plantar foot   10 cm proximal to floor at lateral plantar foot   Circumference of ankle/heel   5 cm proximal to 1st MTP joint   Across MTP joint   Around proximal great toe   (Blank rows = not tested)  FUNCTIONAL TESTS:  {Functional tests:24029}  GAIT: Distance walked: *** Assistive device utilized: {Assistive devices:23999} Level of assistance: {Levels of assistance:24026} Comments: ***  L-DEX LYMPHEDEMA SCREENING: The patient was assessed using the L-Dex machine today to produce a lymphedema index baseline score. The patient will be reassessed on a regular basis (typically every 3 months) to obtain new L-Dex scores. If the score is > 6.5 points away from his/her baseline score indicating onset of subclinical lymphedema, it will be recommended to wear a compression garment for 4 weeks, 12 hours per day and then be reassessed. If the score continues to be > 6.5 points from baseline at reassessment, we will initiate lymphedema treatment. Assessing in this manner has a 95% rate of preventing clinically significant lymphedema.  Outcome measure:                                                                                                                            TREATMENT DATE: ***   PATIENT EDUCATION:  Education details: *** Person educated: {Person educated:25204} Education method: {Education  Method:25205} Education comprehension: {Education Comprehension:25206}  HOME EXERCISE PROGRAM: ***  ASSESSMENT:  CLINICAL IMPRESSION: Patient is a *** y.o. *** who was seen today for physical therapy  evaluation and treatment for ***.   OBJECTIVE IMPAIRMENTS: {opptimpairments:25111}.   ACTIVITY LIMITATIONS: {activitylimitations:27494}  PARTICIPATION LIMITATIONS: {participationrestrictions:25113}  PERSONAL FACTORS: {Personal factors:25162} are also affecting patient's functional outcome.   REHAB POTENTIAL: {rehabpotential:25112}  CLINICAL DECISION MAKING: {clinical decision making:25114}  EVALUATION COMPLEXITY: {Evaluation complexity:25115}   GOALS: Goals reviewed with patient? {yes/no:20286}  SHORT TERM GOALS: Target date: ***  *** Baseline: Goal status: INITIAL  2.  *** Baseline:  Goal status: INITIAL  3.  *** Baseline:  Goal status: INITIAL  4.  *** Baseline:  Goal status: INITIAL  5.  *** Baseline:  Goal status: INITIAL  6.  *** Baseline:  Goal status: INITIAL  LONG TERM GOALS: Target date: ***  *** Baseline:  Goal status: INITIAL  2.  *** Baseline:  Goal status: INITIAL  3.  *** Baseline:  Goal status: INITIAL  4.  *** Baseline:  Goal status: INITIAL  5.  *** Baseline:  Goal status: INITIAL  6.  *** Baseline:  Goal status: INITIAL  PLAN:  PT FREQUENCY: {rehab frequency:25116}  PT DURATION: {rehab duration:25117}  PLANNED INTERVENTIONS: {rehab planned interventions:25118::Patient/Family education,Balance training,Joint mobilization,Therapeutic exercises,Therapeutic activity,Neuromuscular re-education,Gait training,Self Care}  PLAN FOR NEXT SESSION: PIERRETTE Larue Saddie JONELLE, PT 02/09/2024, 12:18 PM

## 2024-02-10 ENCOUNTER — Ambulatory Visit: Admitting: Rehabilitation

## 2024-03-15 NOTE — Therapy (Signed)
 OUTPATIENT PHYSICAL THERAPY  LOWER EXTREMITY ONCOLOGY EVALUATION  Patient Name: Debbie Cline MRN: [id number] DOB:21-Dec-1970, 53 y.o., female Today's Date: 03/16/2024  END OF SESSION:  PT End of Session - 03/16/24 1053     Visit Number 1    Number of Visits 12    Date for PT Re-Evaluation 04/27/24    PT Start Time 1007    PT Stop Time 1053    PT Time Calculation (min) 46 min    Activity Tolerance Patient tolerated treatment well    Behavior During Therapy [facility] for tasks assessed/performed          Past Medical History:  Diagnosis Date   Anemia    Anxiety    with menses   Depression    menses related   Gestational diabetes    Past Surgical History:  Procedure Laterality Date   INTRAUTERINE DEVICE (IUD) INSERTION     mirena  inserted 09-12-13, removed & re-inserted 01-24-2019   IUD REMOVAL     mirena  removed 8/14   Patient Active Problem List   Diagnosis Date Noted   Heart palpitations 03/26/2015   Gestational diabetes    Periumbilical abdominal pain 05/09/2008   Anxiety state 03/24/2008   ABDOMINAL BLOATING 03/24/2008   Abdominal pain 03/24/2008    PCP:   REFERRING PROVIDER: Albesa Duster, PA-C  REFERRING DIAG: Pancreatic Cancer with possible Metastasis to Lung  THERAPY DIAG:  Malignant neoplasm of pancreas, unspecified location of malignancy (HCC)  Malignant neoplasm metastatic to lung, unspecified laterality (HCC)  ONSET DATE: 10/2023  Rationale for Evaluation and Treatment: Habilitation  SUBJECTIVE:                                                                                                                                                                                           SUBJECTIVE STATEMENT:  I get winded with walking and stairs. I feel like my stamina is down and my muscles have turned to mush. I want to get on a regular exercise schedule.I don't trust my body. .Occasional neuropathy in fingers, and feet that lasts about a day after  chemo. Pt thinks her balance is OK. She has not done any significant exercise since she has been diagnosed and on all these meds. Pt moved from [place] to be closer to her children since her new diagnosis. She had lived there 3 years.  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 Pt was diagnosed in April 2025. Pt is presently in a clinical trial at [facility]. [id number] Folfirinox + [facility] 9805 1200 mg daily (G12D selective inhibitor) .  She is presently in Palliative Care.  The trial drug makes me very sick and I am not sure if I may go off of it . Pt has Infusions every 2 weeks and feels bad for 5 days afterwards.  PAIN:  Are you having pain? Yes NPRS scale: 1/10 best to 3-4/10 Pain location: left inguinal area/ left lower quarter Pain orientation: Left inguinal/flank PAIN TYPE: aching and dull Pain description: constant, dull, and aching  Aggravating factors: sitting long periods of time, driving Relieving factors: Dilaudid  PRECAUTIONS: Stage IV Pancreatic Cancer with possible METS to lung, in a clinical trial  RED FLAGS: None   WEIGHT BEARING RESTRICTIONS: No  FALLS:  Has patient fallen in last 6 months? 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   OBJECTIVE: Note: Objective measures were completed at Evaluation unless otherwise noted.  COGNITION: Overall cognitive status: Within functional limits for tasks assessed   PALPATION:   OBSERVATIONS / OTHER ASSESSMENTS: Crepitus in knees with sit to stand  SENSATION: Light touch: Deficits when  experiencing neuropathy after chemo for a day or so    POSTURE: forward head/rounded shoulders  LOWER EXTREMITY STRENGTH:  MMT Right eval  Hip flexion 4+  Hip extension   Hip abduction 4-  Hip adduction   Hip internal rotation 4-  Hip external rotation 3+  Knee flexion 4+  Knee extension 5  Ankle dorsiflexion 5  Ankle plantarflexion 5  Ankle inversion   Ankle eversion   Great toe extension    (Blank rows = not tested)  MMT LEFT eval  Hip flexion 4, pain  Hip extension   Hip abduction 4-  Hip adduction   Hip internal rotation 3+  Hip external rotation 4  Knee flexion 4+  Knee extension 5  Ankle dorsiflexion 5  Ankle plantarflexion 4+  Ankle inversion   Ankle eversion   Great toe extension     (Blank rows = not tested)     FUNCTIONAL TESTS:  4 position balance test; able to perform all positions and maintain x 10 seconds 30 second sit to stand 8 (O2 100%, 81 BPM) GAIT: WNL; no device  Popliteal angle left 25 degrees; Right 27 degrees     Outcome measure:Activity-Specific Balance confidence Scale; 95%                                                                                                                            TREATMENT DATE:  O2 98%, BPM 67 at rest before testing  PATIENT EDUCATION:  Education details: discussed starting some gentle yoga she is familiar with for short periods, and  walking level surface 5 - 10 min Person educated: Patient Education method: Explanation Education comprehension: verbalized understanding  HOME EXERCISE PROGRAM: Pt to start some gentle yoga just to get started and short periods of walking to build endurance starting with 5-10 min on level surfaces  ASSESSMENT:  CLINICAL IMPRESSION: Patient is a 53 y.o. female who was seen today for physical therapy evaluation and treatment for deconditioning caused by Stage IV Pancreatic cancer and the clinical trial Meds she is taking. Pt indicates she is afraid of her  body and is not trusting it. She has done very little activity recently. After chemotherapy she feels sick for 5 days. She is interested in getting started on a new exercise program on a regular basis to build her strength and endurance. She gets winded with stairs and walking. Her balance is quite good, but she does lack strength in selected muscle groups. She will benefit from skilled therapy to address deficits and get her working toward a good exercise program.  OBJECTIVE IMPAIRMENTS: cardiopulmonary status limiting activity, decreased activity tolerance, decreased knowledge of condition, decreased strength, postural dysfunction, and pain.   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   REHAB POTENTIAL: Good  CLINICAL DECISION MAKING: Evolving/moderate complexity  EVALUATION COMPLEXITY: Moderate   GOALS: Goals reviewed with patient? Yes  SHORT TERM GOALS: Target date: 04/06/2024  Pt is independent with a HEP for initial UE and  LE strength, flexibility/endurance Baseline: Goal status: INITIAL  2.  Pt will perform 10 sit to stands in 30 seconds to demonstrate improved power and decrease fall risk Baseline:  Goal status: INITIAL  3.  Pt will be able to complete a 6 min walk test without having to stop for rest Baseline:  Goal status: INITIAL   LONG TERM GOALS: Target date: 04/27/2024  Pt will be able to perform 12 sit to stands in 30 seconds to demonstrate improved power and decrease fall risk Baseline:  Goal status: INITIAL  2.  Pt will improve bilateral LE strength for hip abd to 5/5 Baseline:  Goal status: INITIAL  3.  Pt will improve hip IR and ER strength to atleast 4+/5 for improved strength, hip  stability Baseline:  Goal status: INITIAL  4.  Pt will be able to walk X feet in 6 min walk test to demonstrate improvement in endurance Baseline:  Goal status: INITIAL  5.  Pt will be independent in advanced HEP Baseline:  Goal status: INITIAL  PLAN:  PT FREQUENCY: 2x/week  PT DURATION: 6 weeks  PLANNED INTERVENTIONS: 97164- PT Re-evaluation, 97750- Physical Performance Testing, 97110-Therapeutic exercises, 97530- Therapeutic activity, [id number]- Neuromuscular re-education, 97535- Self Care, and [id number]- Manual therapy  PLAN FOR NEXT SESSION: 6 min walk test;measure O2 every min, set LTG for walk test, band exs. 3 D, hip abd(band walks), IR, ER, HS curls, heel raises, Nu Step, progress as tolerated. Monitor O2 prn. Pt is in clinical trial and has chemo   Grayce JINNY Sheldon, PT 03/16/2024, 6:07 PM

## 2024-03-16 ENCOUNTER — Ambulatory Visit: Attending: Hematology and Oncology

## 2024-03-16 ENCOUNTER — Other Ambulatory Visit: Payer: Self-pay

## 2024-03-16 DIAGNOSIS — C7800 Secondary malignant neoplasm of unspecified lung: Secondary | ICD-10-CM | POA: Diagnosis present

## 2024-03-16 DIAGNOSIS — C259 Malignant neoplasm of pancreas, unspecified: Secondary | ICD-10-CM | POA: Insufficient documentation

## 2024-03-16 DIAGNOSIS — M6281 Muscle weakness (generalized): Secondary | ICD-10-CM | POA: Diagnosis not present

## 2024-03-16 DIAGNOSIS — R5381 Other malaise: Secondary | ICD-10-CM | POA: Insufficient documentation

## 2024-03-23 ENCOUNTER — Ambulatory Visit

## 2024-03-23 DIAGNOSIS — M6281 Muscle weakness (generalized): Secondary | ICD-10-CM

## 2024-03-23 DIAGNOSIS — C259 Malignant neoplasm of pancreas, unspecified: Secondary | ICD-10-CM | POA: Diagnosis not present

## 2024-03-23 DIAGNOSIS — R5381 Other malaise: Secondary | ICD-10-CM

## 2024-03-23 DIAGNOSIS — C7800 Secondary malignant neoplasm of unspecified lung: Secondary | ICD-10-CM

## 2024-03-23 NOTE — Therapy (Signed)
 OUTPATIENT PHYSICAL THERAPY  LOWER EXTREMITY ONCOLOGY TREATMENT  Patient Name: Debbie Cline MRN: [id number] DOB:1971-06-29, 53 y.o., female Today's Date: 03/23/2024  END OF SESSION:  PT End of Session - 03/23/24 1059     Visit Number 2    Number of Visits 12    Date for PT Re-Evaluation 04/27/24    PT Start Time 1100    PT Stop Time 1155    PT Time Calculation (min) 55 min    Activity Tolerance Patient tolerated treatment well    Behavior During Therapy [facility] for tasks assessed/performed          Past Medical History:  Diagnosis Date   Anemia    Anxiety    with menses   Depression    menses related   Gestational diabetes    Past Surgical History:  Procedure Laterality Date   INTRAUTERINE DEVICE (IUD) INSERTION     mirena  inserted 09-12-13, removed & re-inserted 01-24-2019   IUD REMOVAL     mirena  removed 8/14   Patient Active Problem List   Diagnosis Date Noted   Heart palpitations 03/26/2015   Gestational diabetes    Periumbilical abdominal pain 05/09/2008   Anxiety state 03/24/2008   ABDOMINAL BLOATING 03/24/2008   Abdominal pain 03/24/2008    PCP:   REFERRING PROVIDER: Albesa Duster, PA-C  REFERRING DIAG: Pancreatic Cancer with possible Metastasis to Lung  THERAPY DIAG:  Malignant neoplasm of pancreas, unspecified location of malignancy (HCC)  Malignant neoplasm metastatic to lung, unspecified laterality (HCC)  Muscle weakness (generalized)  Physical deconditioning  ONSET DATE: 10/2023  Rationale for Evaluation and Treatment: Habilitation  SUBJECTIVE:                                                                                                                                                                                           SUBJECTIVE STATEMENT:  My white count was low last week so I didn't get chemo. I am not going to do the study drug anymore. I was sore in the left inguinal area after last visit but it was manageable. I have done  some walking 20 min ea time maybe every other day.  I felt good and not winded. I have been getting some bad hot flashes   EVAL I get winded with walking and stairs. I feel like my stamina is down and my muscles have turned to mush. I want to get on a regular exercise schedule.I don't trust my body. 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 Pt thinks her balance is OK. She has not done any significant exercise since  she has been diagnosed and on all these meds. Pt moved from [place] to be closer to her children since her new diagnosis. She had lived there 3 years.  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 Pt was diagnosed in April 2025. Pt is presently in a clinical trial at [facility]. [facility]102 Folfirinox + [facility] 9805 1200 mg daily (G12D selective inhibitor) . She is presently in Palliative Care.  The trial drug makes me very sick and I am not sure if I may go off of it . Pt has Infusions every 2 weeks and feels bad for 5 days afterwards.  PAIN:  Are you having pain? Yes NPRS scale: 1/10 best to 3-4/10 Pain location: left inguinal area/ left lower quarter Pain orientation: Left inguinal/flank PAIN TYPE: aching and dull Pain description: constant, dull, and aching  Aggravating factors: sitting long periods of time, driving Relieving factors: Dilaudid  PRECAUTIONS: Stage IV Pancreatic Cancer with possible METS to lung, in a clinical trial  RED FLAGS: None   WEIGHT BEARING RESTRICTIONS: No  FALLS:  Has patient fallen in last 6 months? No  LIVING ENVIRONMENT: Lives with: lives with their family and Partner Ludie Lives in: House/apartment    OCCUPATION: Massage therapist by trade. Applying for a massage job that allows her to make her own schedule  LEISURE: crochet, reading, walk, yoga ( but hasn't done much recently)  PRIOR LEVEL OF FUNCTION: Independent  PATIENT  GOALS: Get on an exercise program that she can do consistently.   OBJECTIVE: Note: Objective measures were completed at Evaluation unless otherwise noted.  COGNITION: Overall cognitive status: Within functional limits for tasks assessed   PALPATION:   OBSERVATIONS / OTHER ASSESSMENTS: Crepitus in knees with sit to stand  SENSATION: Light touch: Deficits when experiencing neuropathy after chemo for a day or so    POSTURE: forward head/rounded shoulders  LOWER EXTREMITY STRENGTH:  MMT Right eval  Hip flexion 4+  Hip extension   Hip abduction 4-  Hip adduction   Hip internal rotation 4-  Hip external rotation 3+  Knee flexion 4+  Knee extension 5  Ankle dorsiflexion 5  Ankle plantarflexion 5  Ankle inversion   Ankle eversion   Great toe extension    (Blank rows = not tested)  MMT LEFT eval  Hip flexion 4, pain  Hip extension   Hip abduction 4-  Hip adduction   Hip internal rotation 3+  Hip external rotation 4  Knee flexion 4+  Knee extension 5  Ankle dorsiflexion 5  Ankle plantarflexion 4+  Ankle inversion   Ankle eversion   Great toe extension     (Blank rows = not tested)     FUNCTIONAL TESTS:  4 position balance test; able to perform all positions and maintain x 10 seconds 30 second sit to stand 8 (O2 100%, 81 BPM) GAIT: WNL; no device  Popliteal angle left 25 degrees; Right 27 degrees     Outcome measure:Activity-Specific Balance confidence Scale; 95%  TREATMENT DATE:  03/23/2024  O2 97, HR 95 6 min walk test 1102 Feet, 98% O2, 92 BPM Standing 3 D hip theraband yellow Lateral band walks in bars yellow 4 lengths Postural theraband yellow; Bilateral Scapular retraction, shoulder extension, ER Airex pad SLS x 4 ea to failure SLS cone touches x 2 B to failure Updated HEP with illustrated and written  instructions EVAL O2 98%, BPM 67 at rest before testing  PATIENT EDUCATION:  Access Code: [email or web address] URL: [email or web address] Date: 03/23/2024 Prepared by: Grayce Sheldon  Exercises - Band Walks  - 1 x daily - 7 x weekly - 3 sets - 10 reps - Standing Hip Flexion with Resistance Loop  - 1 x daily - 7 x weekly - 1 sets - 10 reps - Standing Hip Abduction with Resistance at Ankles and Counter Support  - 1 x daily - 7 x weekly - 1 sets - 10 reps - Standing Hip Extension with Resistance at Ankles and Counter Support  - 1 x daily - 7 x weekly - 1 sets - 10 reps - Side Stepping with Resistance at Ankles  - 1 x daily - 7 x weekly - 1 sets - 10 reps Also Bilateral scapular retraction, shoulder extension and ER; illustrated and written instructions given Education details: discussed starting some gentle yoga she is familiar with for short periods, and walking level surface 5 - 10 min Person educated: Patient Education method: Explanation Education comprehension: verbalized understanding  HOME EXERCISE PROGRAM: Pt to start some gentle yoga just to get started and short periods of walking to build endurance starting with 5-10 min on level surfaces  ASSESSMENT:  CLINICAL IMPRESSION: Initiated UE and LE strengthening and balance with focus on posture and core activation. Pt did very well using good form with exercises and had no increased pain. Her balance was challenged with airex pad, but she did very well with cone touches with foot on floor.   EVAL Patient is a 53 y.o. female who was seen today for physical therapy evaluation and treatment for deconditioning caused by Stage IV Pancreatic cancer and the clinical trial Meds she is taking. Pt indicates she is afraid of her body and is not trusting it. She has done very little activity recently. After chemotherapy she feels sick for 5 days. She is interested in getting started on a new exercise program on a regular basis to build her  strength and endurance. She gets winded with stairs and walking. Her balance is quite good, but she does lack strength in selected muscle groups. She will benefit from skilled therapy to address deficits and get her working toward a good exercise program.  OBJECTIVE IMPAIRMENTS: cardiopulmonary status limiting activity, decreased activity tolerance, decreased knowledge of condition, decreased strength, postural dysfunction, and pain.   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   REHAB POTENTIAL: Good  CLINICAL DECISION MAKING: Evolving/moderate complexity  EVALUATION COMPLEXITY: Moderate   GOALS: Goals reviewed with patient? Yes  SHORT TERM GOALS: Target date: 04/06/2024  Pt is independent with a HEP for initial UE and  LE strength, flexibility/endurance Baseline: Goal status: INITIAL  2.  Pt will perform 10 sit to stands in 30 seconds to demonstrate improved power and decrease fall risk Baseline:  Goal status: INITIAL  3.  Pt will be able to  complete a 6 min walk test without having to stop for rest Baseline:  Goal status: MET 03/23/2024  LONG TERM GOALS: Target date: 04/27/2024  Pt will be able to perform 12 sit to stands in 30 seconds to demonstrate improved power and decrease fall risk Baseline:  Goal status: INITIAL  2.  Pt will improve bilateral LE strength for hip abd to 5/5 Baseline:  Goal status: INITIAL  3.  Pt will improve hip IR and ER strength to atleast 4+/5 for improved strength, hip stability Baseline:  Goal status: INITIAL  4.  Pt will be able to walk  1250 feet in 6 min walk test to demonstrate improvement in endurance Baseline:  Goal status: INITIAL  5.  Pt will be independent in advanced  HEP Baseline:  Goal status: INITIAL  PLAN:  PT FREQUENCY: 2x/week  PT DURATION: 6 weeks  PLANNED INTERVENTIONS: 97164- PT Re-evaluation, 97750- Physical Performance Testing, 97110-Therapeutic exercises, 97530- Therapeutic activity, [id number]- Neuromuscular re-education, 97535- Self Care, and [id number]- Manual therapy  PLAN FOR NEXT SESSION: 6 min walk test;measure O2 every min, set LTG for walk test, band exs. 3 D, hip abd(band walks), IR, ER, HS curls, heel raises, Nu Step, progress as tolerated. Monitor O2 prn. Pt is in clinical trial and has chemo   Grayce JINNY Sheldon, PT 03/23/2024, 11:59 AM

## 2024-03-29 ENCOUNTER — Ambulatory Visit

## 2024-03-30 ENCOUNTER — Ambulatory Visit

## 2024-04-06 ENCOUNTER — Ambulatory Visit

## 2024-04-08 ENCOUNTER — Ambulatory Visit: Attending: Hematology and Oncology

## 2024-04-08 DIAGNOSIS — C259 Malignant neoplasm of pancreas, unspecified: Secondary | ICD-10-CM | POA: Diagnosis present

## 2024-04-08 DIAGNOSIS — R5381 Other malaise: Secondary | ICD-10-CM | POA: Insufficient documentation

## 2024-04-08 DIAGNOSIS — C7800 Secondary malignant neoplasm of unspecified lung: Secondary | ICD-10-CM | POA: Diagnosis present

## 2024-04-08 DIAGNOSIS — M6281 Muscle weakness (generalized): Secondary | ICD-10-CM | POA: Insufficient documentation

## 2024-04-08 NOTE — Therapy (Signed)
 OUTPATIENT PHYSICAL THERAPY  LOWER EXTREMITY ONCOLOGY TREATMENT  Patient Name: Debbie Cline MRN: [id number] DOB:1971/05/21, 53 y.o., female Today's Date: 04/08/2024  END OF SESSION:  PT End of Session - 04/08/24 1113     Visit Number 3    Number of Visits 12    Date for PT Re-Evaluation 04/27/24    PT Start Time 1109    PT Stop Time 1200    PT Time Calculation (min) 51 min    Activity Tolerance Patient tolerated treatment well    Behavior During Therapy [facility] for tasks assessed/performed          Past Medical History:  Diagnosis Date   Anemia    Anxiety    with menses   Depression    menses related   Gestational diabetes    Past Surgical History:  Procedure Laterality Date   INTRAUTERINE DEVICE (IUD) INSERTION     mirena  inserted 09-12-13, removed & re-inserted 01-24-2019   IUD REMOVAL     mirena  removed 8/14   Patient Active Problem List   Diagnosis Date Noted   Heart palpitations 03/26/2015   Gestational diabetes    Periumbilical abdominal pain 05/09/2008   Anxiety state 03/24/2008   ABDOMINAL BLOATING 03/24/2008   Abdominal pain 03/24/2008    PCP:   REFERRING PROVIDER: Albesa Duster, PA-C  REFERRING DIAG: Pancreatic Cancer with possible Metastasis to Lung  THERAPY DIAG:  Malignant neoplasm of pancreas, unspecified location of malignancy (HCC)  Malignant neoplasm metastatic to lung, unspecified laterality (HCC)  Muscle weakness (generalized)  Physical deconditioning  ONSET DATE: 10/2023  Rationale for Evaluation and Treatment: Habilitation  SUBJECTIVE:                                                                                                                                                                                           SUBJECTIVE STATEMENT:  I did decide to opt out of the study, it was just making me too sick and my scans didn't show any improvements.   EVAL I get winded with walking and stairs. I feel like my stamina is down  and my muscles have turned to mush. I want to get on a regular exercise schedule.I don't trust my body. 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 Pt thinks her balance is OK. She has not done any significant exercise since she has been diagnosed and on all these meds. Pt moved from [place] to be closer to her children since her new diagnosis. She had lived there 3 years.  PERTINENT HISTORY:  Pt with Stage IV pancreatic cancer with lymph node  and possible lung metastasis, no liver involvement. Pt was diagnosed in April 2025. Pt is presently in a clinical trial at [facility]. [facility]102 Folfirinox + [facility] 9805 1200 mg daily (G12D selective inhibitor) . She is presently in Palliative Care.  The trial drug makes me very sick and I am not sure if I may go off of it . Pt has Infusions every 2 weeks and feels bad for 5 days afterwards.  PAIN:  Are you having pain? Yes NPRS scale: 1-2/10  Pain location: left inguinal area/ left lower quarter Pain orientation: Left inguinal/flank PAIN TYPE: aching and dull Pain description: constant, dull, and aching  Aggravating factors: sitting long periods of time, driving Relieving factors: Dilaudid  PRECAUTIONS: Stage IV Pancreatic Cancer with possible METS to lung, in a clinical trial  RED FLAGS: None   WEIGHT BEARING RESTRICTIONS: No  FALLS:  Has patient fallen in last 6 months? 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   OBJECTIVE: Note: Objective measures were completed at Evaluation unless otherwise noted.  COGNITION: Overall cognitive status: Within  functional limits for tasks assessed   PALPATION:   OBSERVATIONS / OTHER ASSESSMENTS: Crepitus in knees with sit to stand  SENSATION: Light touch: Deficits when experiencing neuropathy after chemo for a day or so    POSTURE: forward head/rounded shoulders  LOWER EXTREMITY STRENGTH:  MMT Right eval  Hip flexion 4+  Hip extension   Hip abduction 4-  Hip adduction   Hip internal rotation 4-  Hip external rotation 3+  Knee flexion 4+  Knee extension 5  Ankle dorsiflexion 5  Ankle plantarflexion 5  Ankle inversion   Ankle eversion   Great toe extension    (Blank rows = not tested)  MMT LEFT eval  Hip flexion 4, pain  Hip extension   Hip abduction 4-  Hip adduction   Hip internal rotation 3+  Hip external rotation 4  Knee flexion 4+  Knee extension 5  Ankle dorsiflexion 5  Ankle plantarflexion 4+  Ankle inversion   Ankle eversion   Great toe extension     (Blank rows = not tested)     FUNCTIONAL TESTS:  4 position balance test; able to perform all positions and maintain x 10 seconds 30 second sit to stand 8 (O2 100%, 81 BPM) GAIT: WNL; no device  Popliteal angle left 25 degrees; Right 27 degrees     Outcome measure:Activity-Specific Balance confidence Scale; 95%  TREATMENT DATE:  04/08/24: Therapeutic Exercise NuStep Level 5, UE 8/LE 3, x 8 mins and 311 steps Seated EOB for bil HS stretch and figure 4 piriformis stretch x 2 reps, 20 sec each Neuro Re Ed  In // bars for following: Bil hip 3 way raises with SLS on blue oval returning therapist demo x 10 each Slow, controlled high knee march 2 laps Front and retro heel-toe walking 2 laps each Therapeutic Activities Supine for posterior pelvic tilt x 10, pt with excellent technique Then Tilt with following: Green theraband above knees for clams x 10 and then alt march x 10, removed  band for alt heel slides x 5 each   03/23/2024  O2 97, HR 95 6 min walk test 1102 Feet, 98% O2, 92 BPM Standing 3 D hip theraband yellow Lateral band walks in bars yellow 4 lengths Postural theraband yellow; Bilateral Scapular retraction, shoulder extension, ER Airex pad SLS x 4 ea to failure SLS cone touches x 2 B to failure Updated HEP with illustrated and written instructions EVAL O2 98%, BPM 67 at rest before testing  PATIENT EDUCATION:  Access Code: [email or web address] URL: [email or web address] Date: 03/23/2024 Prepared by: Grayce Sheldon  Exercises - Band Walks  - 1 x daily - 7 x weekly - 3 sets - 10 reps - Standing Hip Flexion with Resistance Loop  - 1 x daily - 7 x weekly - 1 sets - 10 reps - Standing Hip Abduction with Resistance at Ankles and Counter Support  - 1 x daily - 7 x weekly - 1 sets - 10 reps - Standing Hip Extension with Resistance at Ankles and Counter Support  - 1 x daily - 7 x weekly - 1 sets - 10 reps - Side Stepping with Resistance at Ankles  - 1 x daily - 7 x weekly - 1 sets - 10 reps Also Bilateral scapular retraction, shoulder extension and ER; illustrated and written instructions given  Access Code: [email or web address]  (didn't change access code as pt reports only uses paper copy and didn't realize there already was one until completed!) URL: [email or web address] Date: 04/08/2024 Prepared by: Berwyn Knights  Exercises - Tandem Stance with Support  - 1 x daily - 7 x weekly - 1 sets - 5 reps - 30 sec hold - Single Leg Stance with Support  - 1 x daily - 7 x weekly - 1 sets - 5 reps - 30 sec hold - Tandem Walking with Counter Support  - 1 x daily - 7 x weekly - 3 sets - 5 reps - Backward Walking with Counter Support  - 1 x daily - 7 x weekly - 3 sets - 5 reps - Carioca with Counter Support  - 1 x daily - 7 x weekly - 3 sets - 5 reps - Standing March with Counter Support  - 1 x daily - 7 x weekly - 3 sets - 10 reps - 5 hold - Standing Hip  Abduction with Counter Support  - 1 x daily - 7 x weekly - 1-2 sets - 10 reps - Standing Hip Extension with Counter Support  - 1 x daily - 7 x weekly - 1-2 sets - 10 reps - Seated Hamstring Stretch  - 1 x daily - 7 x weekly - 3 reps - 20-30 hold - Seated Piriformis Stretch with Trunk Bend  - 1 x daily - 7 x weekly - 3 reps - 20-30 hold - Supine Posterior Pelvic Tilt  - 1 x daily -  7 x weekly - 1-2 sets - 10 reps - 5 hold - Supine March with Posterior Pelvic Tilt  - 1 x daily - 7 x weekly - 1-2 sets - 10 reps - Supine Transversus Abdominis Bracing with Double Leg Fallout  - 1 x daily - 7 x weekly - 1-2 sets - 10 reps Education details: discussed starting some gentle yoga she is familiar with for short periods, and walking level surface 5 - 10 min Person educated: Patient Education method: Explanation Education comprehension: verbalized understanding  HOME EXERCISE PROGRAM: Pt to start some gentle yoga just to get started and short periods of walking to build endurance starting with 5-10 min on level surfaces  ASSESSMENT:  CLINICAL IMPRESSION: Continued with progression of bil LE strength, flexibility, and balance activities. Also began core stabs in supine. Pt able to return very good demo and reports mild discomfort in Lt inguinals at end of session, but not painful. Pt is interested in learning self MLD for Lt LE as she reports some intermittent increased swelling in LT LE after prolonged walking. Throughout session educated pt about importance if listening to her body and only pushing as much as able without overdoing being mindful that blood count might be low that day due to a recent chemo. Pt able to verbalize good understanding.    EVAL Patient is a 52 y.o. female who was seen today for physical therapy evaluation and treatment for deconditioning caused by Stage IV Pancreatic cancer and the clinical trial Meds she is taking. Pt indicates she is afraid of her body and is not trusting it.  She has done very little activity recently. After chemotherapy she feels sick for 5 days. She is interested in getting started on a new exercise program on a regular basis to build her strength and endurance. She gets winded with stairs and walking. Her balance is quite good, but she does lack strength in selected muscle groups. She will benefit from skilled therapy to address deficits and get her working toward a good exercise program.  OBJECTIVE IMPAIRMENTS: cardiopulmonary status limiting activity, decreased activity tolerance, decreased knowledge of condition, decreased strength, postural dysfunction, and pain.   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   REHAB POTENTIAL: Good  CLINICAL DECISION MAKING: Evolving/moderate complexity  EVALUATION COMPLEXITY: Moderate   GOALS: Goals reviewed with patient? Yes  SHORT TERM GOALS: Target date: 04/06/2024  Pt is independent with a HEP for initial UE and  LE strength, flexibility/endurance Baseline: Goal status: INITIAL  2.  Pt will perform 10 sit to stands in 30 seconds to demonstrate improved power and decrease fall risk Baseline:  Goal status: INITIAL  3.  Pt will be able to complete a 6 min walk test without having to stop for rest Baseline:  Goal status: MET 03/23/2024  LONG TERM GOALS: Target date: 04/27/2024  Pt will be able to perform 12 sit to stands in 30 seconds to demonstrate improved power and decrease fall risk Baseline:  Goal status: INITIAL  2.  Pt will improve bilateral LE strength for hip abd to 5/5 Baseline:  Goal status: INITIAL  3.  Pt will improve hip IR and ER strength to atleast 4+/5 for improved strength, hip stability Baseline:  Goal status:  INITIAL  4.  Pt will be able to walk  1250 feet in 6 min walk test to demonstrate improvement in endurance Baseline:  Goal status: INITIAL  5.  Pt will be independent in advanced HEP Baseline:  Goal status: INITIAL  PLAN:  PT FREQUENCY: 2x/week  PT DURATION: 6 weeks  PLANNED INTERVENTIONS: 97164- PT Re-evaluation, 97750- Physical Performance Testing, 97110-Therapeutic exercises, 97530- Therapeutic activity, [id number]- Neuromuscular re-education, 97535- Self Care, and 02859- Manual therapy  PLAN FOR NEXT SESSION: 6 min walk test with measure O2 every min, set LTG for walk test, Cont band exs. 3 D, hip abd(band walks), IR, ER, HS curls, heel raises, Nu Step, progress as tolerated. Monitor O2 prn. Pt currently getting chemo, has opted out of the study   Aden Berwyn Caldron, PTA 04/08/2024, 12:12 PM

## 2024-04-11 ENCOUNTER — Ambulatory Visit

## 2024-04-13 ENCOUNTER — Ambulatory Visit

## 2024-04-13 DIAGNOSIS — C259 Malignant neoplasm of pancreas, unspecified: Secondary | ICD-10-CM | POA: Diagnosis not present

## 2024-04-13 DIAGNOSIS — R5381 Other malaise: Secondary | ICD-10-CM

## 2024-04-13 DIAGNOSIS — M6281 Muscle weakness (generalized): Secondary | ICD-10-CM

## 2024-04-13 DIAGNOSIS — C7800 Secondary malignant neoplasm of unspecified lung: Secondary | ICD-10-CM

## 2024-04-13 NOTE — Therapy (Signed)
 OUTPATIENT PHYSICAL THERAPY  LOWER EXTREMITY ONCOLOGY TREATMENT  Patient Name: Debbie Cline MRN: [id number] DOB:03/24/1971, 53 y.o., female Today's Date: 04/13/2024  END OF SESSION:  PT End of Session - 04/13/24 1102     Visit Number 4    Number of Visits 12    Date for PT Re-Evaluation 04/27/24    PT Start Time 1103    PT Stop Time 1157    PT Time Calculation (min) 54 min    Activity Tolerance Patient tolerated treatment well    Behavior During Therapy [facility] for tasks assessed/performed          Past Medical History:  Diagnosis Date   Anemia    Anxiety    with menses   Depression    menses related   Gestational diabetes    Past Surgical History:  Procedure Laterality Date   INTRAUTERINE DEVICE (IUD) INSERTION     mirena  inserted 09-12-13, removed & re-inserted 01-24-2019   IUD REMOVAL     mirena  removed 8/14   Patient Active Problem List   Diagnosis Date Noted   Heart palpitations 03/26/2015   Gestational diabetes    Periumbilical abdominal pain 05/09/2008   Anxiety state 03/24/2008   ABDOMINAL BLOATING 03/24/2008   Abdominal pain 03/24/2008    PCP:   REFERRING PROVIDER: Albesa Duster, PA-C  REFERRING DIAG: Pancreatic Cancer with possible Metastasis to Lung  THERAPY DIAG:  Malignant neoplasm of pancreas, unspecified location of malignancy (HCC)  Malignant neoplasm metastatic to lung, unspecified laterality (HCC)  Muscle weakness (generalized)  Physical deconditioning  ONSET DATE: 10/2023  Rationale for Evaluation and Treatment: Habilitation  SUBJECTIVE:                                                                                                                                                                                           SUBJECTIVE STATEMENT:  A little sore sore in my left hip. I have been having some diarrhea and nausea for several days and I don't feel great but I really want to be here. I always feel better when I  finish.  EVAL I get winded with walking and stairs. I feel like my stamina is down and my muscles have turned to mush. I want to get on a regular exercise schedule.I don't trust my body. 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 Pt thinks her balance is OK. She has not done any significant exercise since she has been diagnosed and on all these meds. Pt moved from [place] to be closer to her children since her new diagnosis. She had lived there 3  years.  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 Pt was diagnosed in April 2025. Pt is presently in a clinical trial at [facility]. [facility]-GI-[id number] Folfirinox + [facility] 9805 1200 mg daily (G12D selective inhibitor) . She is presently in Palliative Care.  The trial drug makes me very sick and I am not sure if I may go off of it . Pt has Infusions every 2 weeks and feels bad for 5 days afterwards.  PAIN:  Are you having pain? Yes NPRS scale: 1-2/10  Pain location: left inguinal area/ left lower quarter and around to buttocks Pain orientation: Left inguinal/flank PAIN TYPE: aching and dull Pain description: constant, dull, and aching  Aggravating factors: sitting long periods of time, driving Relieving factors: Dilaudid  PRECAUTIONS: Stage IV Pancreatic Cancer with possible METS to lung, in a clinical trial  RED FLAGS: None   WEIGHT BEARING RESTRICTIONS: No  FALLS:  Has patient fallen in last 6 months? 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   OBJECTIVE: Note: Objective measures  were completed at Evaluation unless otherwise noted.  COGNITION: Overall cognitive status: Within functional limits for tasks assessed   PALPATION:   OBSERVATIONS / OTHER ASSESSMENTS: Crepitus in knees with sit to stand  SENSATION: Light touch: Deficits when experiencing neuropathy after chemo for a day or so    POSTURE: forward head/rounded shoulders  LOWER EXTREMITY STRENGTH:  MMT Right eval  Hip flexion 4+  Hip extension   Hip abduction 4-  Hip adduction   Hip internal rotation 4-  Hip external rotation 3+  Knee flexion 4+  Knee extension 5  Ankle dorsiflexion 5  Ankle plantarflexion 5  Ankle inversion   Ankle eversion   Great toe extension    (Blank rows = not tested)  MMT LEFT eval  Hip flexion 4, pain  Hip extension   Hip abduction 4-  Hip adduction   Hip internal rotation 3+  Hip external rotation 4  Knee flexion 4+  Knee extension 5  Ankle dorsiflexion 5  Ankle plantarflexion 4+  Ankle inversion   Ankle eversion   Great toe extension     (Blank rows = not tested)     FUNCTIONAL TESTS:  4 position balance test; able to perform all positions and maintain x 10 seconds 30 second sit to stand 8 (O2 100%, 81 BPM) GAIT: WNL; no device  Popliteal angle left 25 degrees; Right 27 degrees     Outcome measure:Activity-Specific Balance confidence Scale; 95%  TREATMENT DATE:   04/13/2024 Discussed importance of pt telling me if she needs rest or is doing too much today secondary to not feeling great. Mini squats x 10 Lunges 2 x 5 ea side Nu step seat 3, UE 8, L 5 x 7 min Hip IR and ER with yellow band x 10 ea seated on high table Bilateral HS with yellow band x 10 ea Manual stretch to bilateral HS x 3 ea, 20 -30 sec Figure 4 stretch bilaterally 3 x 30 ea Updated HEP  04/08/24: Therapeutic Exercise NuStep Level 5, UE 8/LE  3, x 8 mins and 311 steps Seated EOB for bil HS stretch and figure 4 piriformis stretch x 2 reps, 20 sec each Neuro Re Ed  In // bars for following: Bil hip 3 way raises with SLS on blue oval returning therapist demo x 10 each Slow, controlled high knee march 2 laps Front and retro heel-toe walking 2 laps each Therapeutic Activities Supine for posterior pelvic tilt x 10, pt with excellent technique Then Tilt with following: Green theraband above knees for clams x 10 and then alt march x 10, removed band for alt heel slides x 5 each   03/23/2024  O2 97, HR 95 6 min walk test 1102 Feet, 98% O2, 92 BPM Standing 3 D hip theraband yellow Lateral band walks in bars yellow 4 lengths Postural theraband yellow; Bilateral Scapular retraction, shoulder extension, ER Airex pad SLS x 4 ea to failure SLS cone touches x 2 B to failure Updated HEP with illustrated and written instructions EVAL O2 98%, BPM 67 at rest before testing  PATIENT EDUCATION:  Access Code: [email or web address] URL: [email or web address] Date: 04/13/2024 Prepared by: Grayce Sheldon  Exercises - Seated Hip Internal Rotation with Resistance  - 1 x daily - 7 x weekly - 1 sets - 10 reps - Seated Hip External Rotation with Resistance  - 1 x daily - 7 x weekly - 1 sets - 10 reps - Seated Hamstring Curl with Anchored Resistance  - 1 x daily - 7 x weekly - 1 sets - 10 reps   Access Code: [email or web address] URL: [email or web address] Date: 03/23/2024 Prepared by: Grayce Sheldon  Exercises - Band Walks  - 1 x daily - 7 x weekly - 3 sets - 10 reps - Standing Hip Flexion with Resistance Loop  - 1 x daily - 7 x weekly - 1 sets - 10 reps - Standing Hip Abduction with Resistance at Ankles and Counter Support  - 1 x daily - 7 x weekly - 1 sets - 10 reps - Standing Hip Extension with Resistance at Ankles and Counter Support  - 1 x daily - 7 x weekly - 1 sets - 10 reps - Side Stepping with Resistance at Ankles  - 1 x daily - 7 x  weekly - 1 sets - 10 reps Also Bilateral scapular retraction, shoulder extension and ER; illustrated and written instructions given  Access Code: [email or web address]  (didn't change access code as pt reports only uses paper copy and didn't realize there already was one until completed!) URL: [email or web address] Date: 04/08/2024 Prepared by: Berwyn Knights  Exercises - Tandem Stance with Support  - 1 x daily - 7 x weekly - 1 sets - 5 reps - 30 sec hold - Single Leg Stance with Support  - 1 x daily - 7 x weekly - 1 sets - 5 reps - 30 sec hold - Tandem Walking with Counter Support  - 1 x daily - 7  x weekly - 3 sets - 5 reps - Backward Walking with Counter Support  - 1 x daily - 7 x weekly - 3 sets - 5 reps - Carioca with Counter Support  - 1 x daily - 7 x weekly - 3 sets - 5 reps - Standing March with Counter Support  - 1 x daily - 7 x weekly - 3 sets - 10 reps - 5 hold - Standing Hip Abduction with Counter Support  - 1 x daily - 7 x weekly - 1-2 sets - 10 reps - Standing Hip Extension with Counter Support  - 1 x daily - 7 x weekly - 1-2 sets - 10 reps - Seated Hamstring Stretch  - 1 x daily - 7 x weekly - 3 reps - 20-30 hold - Seated Piriformis Stretch with Trunk Bend  - 1 x daily - 7 x weekly - 3 reps - 20-30 hold - Supine Posterior Pelvic Tilt  - 1 x daily - 7 x weekly - 1-2 sets - 10 reps - 5 hold - Supine March with Posterior Pelvic Tilt  - 1 x daily - 7 x weekly - 1-2 sets - 10 reps - Supine Transversus Abdominis Bracing with Double Leg Fallout  - 1 x daily - 7 x weekly - 1-2 sets - 10 reps Education details: discussed starting some gentle yoga she is familiar with for short periods, and walking level surface 5 - 10 min Person educated: Patient Education method: Explanation Education comprehension: verbalized understanding  HOME EXERCISE PROGRAM: Pt to start some gentle yoga just to get started and short periods of walking to build endurance starting with 5-10 min on level  surfaces  ASSESSMENT:  CLINICAL IMPRESSION: Continued with progression of bil LE strength, flexibility, and balance activities. Pt used very good form with mini squats and lunges . HEP was updated to include bilateral hip strength.  Pt reported feeling much better after treatment today.   EVAL Patient is a 53 y.o. female who was seen today for physical therapy evaluation and treatment for deconditioning caused by Stage IV Pancreatic cancer and the clinical trial Meds she is taking. Pt indicates she is afraid of her body and is not trusting it. She has done very little activity recently. After chemotherapy she feels sick for 5 days. She is interested in getting started on a new exercise program on a regular basis to build her strength and endurance. She gets winded with stairs and walking. Her balance is quite good, but she does lack strength in selected muscle groups. She will benefit from skilled therapy to address deficits and get her working toward a good exercise program.  OBJECTIVE IMPAIRMENTS: cardiopulmonary status limiting activity, decreased activity tolerance, decreased knowledge of condition, decreased strength, postural dysfunction, and pain.   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   REHAB POTENTIAL: Good  CLINICAL DECISION MAKING: Evolving/moderate complexity  EVALUATION COMPLEXITY: Moderate   GOALS: Goals reviewed with patient? Yes  SHORT TERM GOALS: Target date: 04/06/2024  Pt is independent with a HEP for initial UE and  LE strength, flexibility/endurance Baseline: Goal status: INITIAL  2.  Pt will perform 10 sit to stands in 30 seconds to demonstrate improved power and decrease fall risk Baseline:  Goal  status: INITIAL  3.  Pt will be able  to complete a 6 min walk test without having to stop for rest Baseline:  Goal status: MET 03/23/2024  LONG TERM GOALS: Target date: 04/27/2024  Pt will be able to perform 12 sit to stands in 30 seconds to demonstrate improved power and decrease fall risk Baseline:  Goal status: INITIAL  2.  Pt will improve bilateral LE strength for hip abd to 5/5 Baseline:  Goal status: INITIAL  3.  Pt will improve hip IR and ER strength to atleast 4+/5 for improved strength, hip stability Baseline:  Goal status: INITIAL  4.  Pt will be able to walk  1250 feet in 6 min walk test to demonstrate improvement in endurance Baseline:  Goal status: INITIAL  5.  Pt will be independent in advanced HEP Baseline:  Goal status: INITIAL  PLAN:  PT FREQUENCY: 2x/week  PT DURATION: 6 weeks  PLANNED INTERVENTIONS: 97164- PT Re-evaluation, 97750- Physical Performance Testing, 97110-Therapeutic exercises, 97530- Therapeutic activity, [id number]- Neuromuscular re-education, 97535- Self Care, and [id number]- Manual therapy  PLAN FOR NEXT SESSION: compression stocking for left LE? MLD ? Cont band exs. 3 D, hip abd(band walks),  heel raises, Nu Step, progress as tolerated. Monitor O2 prn. Pt currently getting chemo, has opted out of the study   Grayce JINNY Sheldon, PT 04/13/2024, 1:04 PM

## 2024-04-20 ENCOUNTER — Ambulatory Visit

## 2024-04-22 ENCOUNTER — Encounter

## 2024-04-25 ENCOUNTER — Ambulatory Visit

## 2024-04-25 DIAGNOSIS — M6281 Muscle weakness (generalized): Secondary | ICD-10-CM

## 2024-04-25 DIAGNOSIS — C259 Malignant neoplasm of pancreas, unspecified: Secondary | ICD-10-CM | POA: Diagnosis not present

## 2024-04-25 DIAGNOSIS — R5381 Other malaise: Secondary | ICD-10-CM

## 2024-04-25 DIAGNOSIS — C7800 Secondary malignant neoplasm of unspecified lung: Secondary | ICD-10-CM

## 2024-04-25 NOTE — Therapy (Signed)
 OUTPATIENT PHYSICAL THERAPY  LOWER EXTREMITY ONCOLOGY TREATMENT  Patient Name: Debbie Cline MRN: [id number] DOB:1970/10/03, 53 y.o., female Today's Date: 04/25/2024  END OF SESSION:  PT End of Session - 04/25/24 1107     Visit Number 5    Number of Visits 12    Date for Recertification  04/27/24    PT Start Time 1102    PT Stop Time 1156    PT Time Calculation (min) 54 min    Activity Tolerance Patient tolerated treatment well    Behavior During Therapy [facility] for tasks assessed/performed          Past Medical History:  Diagnosis Date   Anemia    Anxiety    with menses   Depression    menses related   Gestational diabetes    Past Surgical History:  Procedure Laterality Date   INTRAUTERINE DEVICE (IUD) INSERTION     mirena  inserted 09-12-13, removed & re-inserted 01-24-2019   IUD REMOVAL     mirena  removed 8/14   Patient Active Problem List   Diagnosis Date Noted   Heart palpitations 03/26/2015   Gestational diabetes    Periumbilical abdominal pain 05/09/2008   Anxiety state 03/24/2008   ABDOMINAL BLOATING 03/24/2008   Abdominal pain 03/24/2008    PCP:   REFERRING PROVIDER: Albesa Duster, PA-C  REFERRING DIAG: Pancreatic Cancer with possible Metastasis to Lung  THERAPY DIAG:  Malignant neoplasm of pancreas, unspecified location of malignancy (HCC)  Malignant neoplasm metastatic to lung, unspecified laterality (HCC)  Muscle weakness (generalized)  Physical deconditioning  ONSET DATE: 10/2023  Rationale for Evaluation and Treatment: Habilitation  SUBJECTIVE:                                                                                                                                                                                           SUBJECTIVE STATEMENT:  I'm feeling better today than I was last week. It just takes me about a week to get over my chemo treatments.   EVAL I get winded with walking and stairs. I feel like my stamina is down  and my muscles have turned to mush. I want to get on a regular exercise schedule.I don't trust my body. 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 Pt thinks her balance is OK. She has not done any significant exercise since she has been diagnosed and on all these meds. Pt moved from [place] to be closer to her children since her new diagnosis. She had lived there 3 years.  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 Pt was diagnosed in April 2025. Pt is presently in a clinical trial at [facility]. [id number]-[id number] Folfirinox + [facility] 9805 1200 mg daily (G12D selective inhibitor) . She is presently in Palliative Care.  The trial drug makes me very sick and I am not sure if I may go off of it . Pt has Infusions every 2 weeks and feels bad for 5 days afterwards.  PAIN:  Are you having pain? Yes NPRS scale: 3-3.5/10  Pain location: left inguinal area/ left lower quarter and around to buttocks Pain orientation: Left inguinal/flank PAIN TYPE: aching and dull Pain description: constant, dull, and aching  Aggravating factors: sitting long periods of time, driving Relieving factors: Dilaudid  PRECAUTIONS: Stage IV Pancreatic Cancer with possible METS to lung, in a clinical trial  RED FLAGS: None   WEIGHT BEARING RESTRICTIONS: No  FALLS:  Has patient fallen in last 6 months? 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   OBJECTIVE: Note: Objective measures were completed at Evaluation unless otherwise noted.  COGNITION: Overall  cognitive status: Within functional limits for tasks assessed   PALPATION:   OBSERVATIONS / OTHER ASSESSMENTS: Crepitus in knees with sit to stand  SENSATION: Light touch: Deficits when experiencing neuropathy after chemo for a day or so    POSTURE: forward head/rounded shoulders  LOWER EXTREMITY STRENGTH:  MMT Right eval  Hip flexion 4+  Hip extension   Hip abduction 4-  Hip adduction   Hip internal rotation 4-  Hip external rotation 3+  Knee flexion 4+  Knee extension 5  Ankle dorsiflexion 5  Ankle plantarflexion 5  Ankle inversion   Ankle eversion   Great toe extension    (Blank rows = not tested)  MMT LEFT eval  Hip flexion 4, pain  Hip extension   Hip abduction 4-  Hip adduction   Hip internal rotation 3+  Hip external rotation 4  Knee flexion 4+  Knee extension 5  Ankle dorsiflexion 5  Ankle plantarflexion 4+  Ankle inversion   Ankle eversion   Great toe extension     (Blank rows = not tested)     FUNCTIONAL TESTS:  4 position balance test; able to perform all positions and maintain x 10 seconds 30 second sit to stand 8 (O2 100%, 81 BPM) GAIT: WNL; no device  Popliteal angle left 25 degrees; Right 27 degrees     Outcome measure:Activity-Specific Balance confidence Scale; 95%  TREATMENT DATE:   04/25/24: Therapeutic Exercises and Therapeutic Activities  Nu step seat 3, UE 8, Level 4 x 8 mins, Level 5 x 2 mins for 10 mins total Free Motion Machine: Scap Retract 7# x 10 reps, bil UE ext 3#, 10 reps returning therapist demo and VC's to keep core engaged Mini Squats with +2 HHA on purple Airex x 10 reps with VC's to remind of correct technique and for glut/core contraction for full return to stand Lunges front 2 x 5 each with no HHA, then bil side lunges with +2 HHA 3 x 5 each Standing HS stretch with foot on side of TM and  UE support on rail x 2 reps, 20 sec holds, then piriformis figure 4 stretch in standing holding TM rail x 15 sec, 1 reps each returning demo  Leg Press 55#, 2 x 10 with VC's for correct technique Hip Machine 25# x 10 for abd and ext, Lt abd x 7 and stopped due to increased discomfort in Rt hip/abductors Seated bil piriformis stretch x 1 reps, 20 sec holds Standing HS stretch with foot on Power Plate x 1 rep, 30 sec each   04/13/2024 Discussed importance of pt telling me if she needs rest or is doing too much today secondary to not feeling great. Mini squats x 10 Lunges 2 x 5 ea side Nu step seat 3, UE 8, L 5 x 7 min Hip IR and ER with yellow band x 10 ea seated on high table Bilateral HS with yellow band x 10 ea Manual stretch to bilateral HS x 3 ea, 20 -30 sec Figure 4 stretch bilaterally 3 x 30 ea Updated HEP  04/08/24: Therapeutic Exercise NuStep Level 5, UE 8/LE 3, x 8 mins and 311 steps Seated EOB for bil HS stretch and figure 4 piriformis stretch x 2 reps, 20 sec each Neuro Re Ed  In // bars for following: Bil hip 3 way raises with SLS on blue oval returning therapist demo x 10 each Slow, controlled high knee march 2 laps Front and retro heel-toe walking 2 laps each Therapeutic Activities Supine for posterior pelvic tilt x 10, pt with excellent technique Then Tilt with following: Green theraband above knees for clams x 10 and then alt march x 10, removed band for alt heel slides x 5 each   03/23/2024  O2 97, HR 95 6 min walk test 1102 Feet, 98% O2, 92 BPM Standing 3 D hip theraband yellow Lateral band walks in bars yellow 4 lengths Postural theraband yellow; Bilateral Scapular retraction, shoulder extension, ER Airex pad SLS x 4 ea to failure SLS cone touches x 2 B to failure Updated HEP with illustrated and written instructions EVAL O2 98%, BPM 67 at rest before testing  PATIENT EDUCATION:  Access Code: [email or web address] URL: [email or web address] Date:  04/13/2024 Prepared by: Grayce Sheldon  Exercises - Seated Hip Internal Rotation with Resistance  - 1 x daily - 7 x weekly - 1 sets - 10 reps - Seated Hip External Rotation with Resistance  - 1 x daily - 7 x weekly - 1 sets - 10 reps - Seated Hamstring Curl with Anchored Resistance  - 1 x daily - 7 x weekly - 1 sets - 10 reps   Access Code: [email or web address] URL: [email or web address] Date: 03/23/2024 Prepared by: Grayce Sheldon  Exercises - Band Walks  - 1 x daily - 7 x weekly - 3 sets - 10 reps - Standing Hip Flexion with Resistance Loop  - 1  x daily - 7 x weekly - 1 sets - 10 reps - Standing Hip Abduction with Resistance at Ankles and Counter Support  - 1 x daily - 7 x weekly - 1 sets - 10 reps - Standing Hip Extension with Resistance at Ankles and Counter Support  - 1 x daily - 7 x weekly - 1 sets - 10 reps - Side Stepping with Resistance at Ankles  - 1 x daily - 7 x weekly - 1 sets - 10 reps Also Bilateral scapular retraction, shoulder extension and ER; illustrated and written instructions given  Access Code: [email or web address]  (didn't change access code as pt reports only uses paper copy and didn't realize there already was one until completed!) URL: [email or web address] Date: 04/08/2024 Prepared by: Berwyn Knights  Exercises - Tandem Stance with Support  - 1 x daily - 7 x weekly - 1 sets - 5 reps - 30 sec hold - Single Leg Stance with Support  - 1 x daily - 7 x weekly - 1 sets - 5 reps - 30 sec hold - Tandem Walking with Counter Support  - 1 x daily - 7 x weekly - 3 sets - 5 reps - Backward Walking with Counter Support  - 1 x daily - 7 x weekly - 3 sets - 5 reps - Carioca with Counter Support  - 1 x daily - 7 x weekly - 3 sets - 5 reps - Standing March with Counter Support  - 1 x daily - 7 x weekly - 3 sets - 10 reps - 5 hold - Standing Hip Abduction with Counter Support  - 1 x daily - 7 x weekly - 1-2 sets - 10 reps - Standing Hip Extension with Counter Support   - 1 x daily - 7 x weekly - 1-2 sets - 10 reps - Seated Hamstring Stretch  - 1 x daily - 7 x weekly - 3 reps - 20-30 hold - Seated Piriformis Stretch with Trunk Bend  - 1 x daily - 7 x weekly - 3 reps - 20-30 hold - Supine Posterior Pelvic Tilt  - 1 x daily - 7 x weekly - 1-2 sets - 10 reps - 5 hold - Supine March with Posterior Pelvic Tilt  - 1 x daily - 7 x weekly - 1-2 sets - 10 reps - Supine Transversus Abdominis Bracing with Double Leg Fallout  - 1 x daily - 7 x weekly - 1-2 sets - 10 reps Education details: discussed starting some gentle yoga she is familiar with for short periods, and walking level surface 5 - 10 min Person educated: Patient Education method: Explanation Education comprehension: verbalized understanding  HOME EXERCISE PROGRAM: Pt to start some gentle yoga just to get started and short periods of walking to build endurance starting with 5-10 min on level surfaces  ASSESSMENT:  CLINICAL IMPRESSION: Pt was feeling much better today as she is further out from her last chemo therapy so was able to tolerate moderate progression with light resistance and weights. Pt reports feeling good after session and enjoyed being able to tolerate being pushed a bit more.    EVAL Patient is a 53 y.o. female who was seen today for physical therapy evaluation and treatment for deconditioning caused by Stage IV Pancreatic cancer and the clinical trial Meds she is taking. Pt indicates she is afraid of her body and is not trusting it. She has done very little activity recently. After chemotherapy she feels sick for 5 days. She  is interested in getting started on a new exercise program on a regular basis to build her strength and endurance. She gets winded with stairs and walking. Her balance is quite good, but she does lack strength in selected muscle groups. She will benefit from skilled therapy to address deficits and get her working toward a good exercise program.  OBJECTIVE IMPAIRMENTS:  cardiopulmonary status limiting activity, decreased activity tolerance, decreased knowledge of condition, decreased strength, postural dysfunction, and pain.   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   REHAB POTENTIAL: Good  CLINICAL DECISION MAKING: Evolving/moderate complexity  EVALUATION COMPLEXITY: Moderate   GOALS: Goals reviewed with patient? Yes  SHORT TERM GOALS: Target date: 04/06/2024  Pt is independent with a HEP for initial UE and  LE strength, flexibility/endurance Baseline: Goal status: INITIAL  2.  Pt will perform 10 sit to stands in 30 seconds to demonstrate improved power and decrease fall risk Baseline:  Goal status: INITIAL  3.  Pt will be able to complete a 6 min walk test without having to stop for rest Baseline:  Goal status: MET 03/23/2024  LONG TERM GOALS: Target date: 04/27/2024  Pt will be able to perform 12 sit to stands in 30 seconds to demonstrate improved power and decrease fall risk Baseline:  Goal status: INITIAL  2.  Pt will improve bilateral LE strength for hip abd to 5/5 Baseline:  Goal status: INITIAL  3.  Pt will improve hip IR and ER strength to atleast 4+/5 for improved strength, hip stability Baseline:  Goal status: INITIAL  4.  Pt will be able to walk  1250 feet in 6 min walk test to demonstrate improvement in endurance Baseline:  Goal status: INITIAL  5.  Pt will be independent in advanced HEP Baseline:  Goal status: INITIAL  PLAN:  PT FREQUENCY: 2x/week  PT DURATION: 6 weeks  PLANNED INTERVENTIONS: 97164- PT Re-evaluation, 97750- Physical Performance Testing, 97110-Therapeutic exercises, 97530- Therapeutic activity, [id number]- Neuromuscular re-education, 97535-  Self Care, and [id number]- Manual therapy  PLAN FOR NEXT SESSION: Renewal next/assess goals. compression stocking for left LE? MLD ? Cont band exs. 3 D, hip abd(band walks),  heel raises, Nu Step, progress as tolerated. Monitor O2 prn. Pt currently getting chemo, has opted out of the study   Aden Berwyn Caldron, PTA 04/25/2024, 12:22 PM

## 2024-04-27 ENCOUNTER — Encounter

## 2024-05-12 ENCOUNTER — Telehealth: Payer: Self-pay

## 2024-05-12 ENCOUNTER — Ambulatory Visit: Attending: Hematology and Oncology

## 2024-05-12 DIAGNOSIS — C259 Malignant neoplasm of pancreas, unspecified: Secondary | ICD-10-CM | POA: Insufficient documentation

## 2024-05-12 DIAGNOSIS — M6281 Muscle weakness (generalized): Secondary | ICD-10-CM | POA: Insufficient documentation

## 2024-05-12 DIAGNOSIS — R5381 Other malaise: Secondary | ICD-10-CM | POA: Insufficient documentation

## 2024-05-12 DIAGNOSIS — C7800 Secondary malignant neoplasm of unspecified lung: Secondary | ICD-10-CM | POA: Insufficient documentation

## 2024-05-12 NOTE — Telephone Encounter (Signed)
 LVM for Ms Debbie Cline in regards to missing her physical therapy appt today asking her to call back our clinic. We will be happy to R/S her appt if she would like.

## 2024-05-26 ENCOUNTER — Ambulatory Visit

## 2024-07-14 ENCOUNTER — Ambulatory Visit: Admitting: Family Medicine

## 2024-11-23 ENCOUNTER — Ambulatory Visit: Admitting: Family Medicine
